# Patient Record
Sex: Female | Born: 1940 | Race: White | Hispanic: No | Marital: Single | Smoking: Never smoker
Health system: Southern US, Community
[De-identification: ages and names within clinical notes are randomized; demographics above are authoritative.]

## PROBLEM LIST (undated history)

## (undated) DIAGNOSIS — E119 Type 2 diabetes mellitus without complications: Secondary | ICD-10-CM

## (undated) DIAGNOSIS — C801 Malignant (primary) neoplasm, unspecified: Secondary | ICD-10-CM

## (undated) DIAGNOSIS — I639 Cerebral infarction, unspecified: Secondary | ICD-10-CM

## (undated) DIAGNOSIS — I10 Essential (primary) hypertension: Secondary | ICD-10-CM

## (undated) DIAGNOSIS — I509 Heart failure, unspecified: Secondary | ICD-10-CM

## (undated) HISTORY — PX: PARTIAL COLECTOMY: SHX5273

---

## 2019-09-18 ENCOUNTER — Other Ambulatory Visit: Payer: Self-pay

## 2019-09-18 ENCOUNTER — Emergency Department: Payer: Medicare HMO

## 2019-09-18 ENCOUNTER — Inpatient Hospital Stay
Admission: EM | Admit: 2019-09-18 | Discharge: 2019-09-21 | DRG: 871 | Disposition: A | Discharge status: Hospice | Payer: Medicare HMO | Attending: Internal Medicine | Admitting: Internal Medicine

## 2019-09-18 DIAGNOSIS — Z515 Encounter for palliative care: Secondary | ICD-10-CM | POA: Diagnosis not present

## 2019-09-18 DIAGNOSIS — W19XXXA Unspecified fall, initial encounter: Secondary | ICD-10-CM | POA: Diagnosis present

## 2019-09-18 DIAGNOSIS — C786 Secondary malignant neoplasm of retroperitoneum and peritoneum: Secondary | ICD-10-CM | POA: Diagnosis present

## 2019-09-18 DIAGNOSIS — E871 Hypo-osmolality and hyponatremia: Secondary | ICD-10-CM | POA: Diagnosis not present

## 2019-09-18 DIAGNOSIS — F329 Major depressive disorder, single episode, unspecified: Secondary | ICD-10-CM | POA: Diagnosis present

## 2019-09-18 DIAGNOSIS — I5032 Chronic diastolic (congestive) heart failure: Secondary | ICD-10-CM | POA: Diagnosis present

## 2019-09-18 DIAGNOSIS — K922 Gastrointestinal hemorrhage, unspecified: Secondary | ICD-10-CM | POA: Diagnosis present

## 2019-09-18 DIAGNOSIS — R54 Age-related physical debility: Secondary | ICD-10-CM | POA: Diagnosis not present

## 2019-09-18 DIAGNOSIS — R531 Weakness: Secondary | ICD-10-CM | POA: Diagnosis not present

## 2019-09-18 DIAGNOSIS — G9341 Metabolic encephalopathy: Secondary | ICD-10-CM | POA: Diagnosis present

## 2019-09-18 DIAGNOSIS — R195 Other fecal abnormalities: Secondary | ICD-10-CM | POA: Diagnosis present

## 2019-09-18 DIAGNOSIS — E222 Syndrome of inappropriate secretion of antidiuretic hormone: Secondary | ICD-10-CM | POA: Diagnosis present

## 2019-09-18 DIAGNOSIS — Z888 Allergy status to other drugs, medicaments and biological substances status: Secondary | ICD-10-CM

## 2019-09-18 DIAGNOSIS — Z6826 Body mass index (BMI) 26.0-26.9, adult: Secondary | ICD-10-CM | POA: Diagnosis not present

## 2019-09-18 DIAGNOSIS — E1143 Type 2 diabetes mellitus with diabetic autonomic (poly)neuropathy: Secondary | ICD-10-CM | POA: Diagnosis present

## 2019-09-18 DIAGNOSIS — Z9049 Acquired absence of other specified parts of digestive tract: Secondary | ICD-10-CM

## 2019-09-18 DIAGNOSIS — D62 Acute posthemorrhagic anemia: Secondary | ICD-10-CM | POA: Diagnosis present

## 2019-09-18 DIAGNOSIS — Z20822 Contact with and (suspected) exposure to covid-19: Secondary | ICD-10-CM | POA: Diagnosis present

## 2019-09-18 DIAGNOSIS — Z66 Do not resuscitate: Secondary | ICD-10-CM | POA: Diagnosis present

## 2019-09-18 DIAGNOSIS — Z8673 Personal history of transient ischemic attack (TIA), and cerebral infarction without residual deficits: Secondary | ICD-10-CM | POA: Diagnosis not present

## 2019-09-18 DIAGNOSIS — I639 Cerebral infarction, unspecified: Secondary | ICD-10-CM | POA: Diagnosis present

## 2019-09-18 DIAGNOSIS — I10 Essential (primary) hypertension: Secondary | ICD-10-CM

## 2019-09-18 DIAGNOSIS — K3184 Gastroparesis: Secondary | ICD-10-CM | POA: Diagnosis present

## 2019-09-18 DIAGNOSIS — E119 Type 2 diabetes mellitus without complications: Secondary | ICD-10-CM | POA: Diagnosis not present

## 2019-09-18 DIAGNOSIS — A419 Sepsis, unspecified organism: Secondary | ICD-10-CM | POA: Diagnosis present

## 2019-09-18 DIAGNOSIS — R112 Nausea with vomiting, unspecified: Secondary | ICD-10-CM | POA: Diagnosis present

## 2019-09-18 DIAGNOSIS — K659 Peritonitis, unspecified: Secondary | ICD-10-CM | POA: Diagnosis present

## 2019-09-18 DIAGNOSIS — I110 Hypertensive heart disease with heart failure: Secondary | ICD-10-CM | POA: Diagnosis present

## 2019-09-18 DIAGNOSIS — E875 Hyperkalemia: Secondary | ICD-10-CM | POA: Diagnosis present

## 2019-09-18 DIAGNOSIS — K567 Ileus, unspecified: Secondary | ICD-10-CM | POA: Diagnosis present

## 2019-09-18 DIAGNOSIS — E876 Hypokalemia: Secondary | ICD-10-CM | POA: Diagnosis present

## 2019-09-18 DIAGNOSIS — R140 Abdominal distension (gaseous): Secondary | ICD-10-CM

## 2019-09-18 DIAGNOSIS — R197 Diarrhea, unspecified: Secondary | ICD-10-CM | POA: Diagnosis not present

## 2019-09-18 DIAGNOSIS — I509 Heart failure, unspecified: Secondary | ICD-10-CM

## 2019-09-18 DIAGNOSIS — E440 Moderate protein-calorie malnutrition: Secondary | ICD-10-CM | POA: Diagnosis present

## 2019-09-18 DIAGNOSIS — C189 Malignant neoplasm of colon, unspecified: Secondary | ICD-10-CM | POA: Diagnosis present

## 2019-09-18 DIAGNOSIS — C19 Malignant neoplasm of rectosigmoid junction: Secondary | ICD-10-CM | POA: Diagnosis present

## 2019-09-18 DIAGNOSIS — R180 Malignant ascites: Secondary | ICD-10-CM | POA: Diagnosis present

## 2019-09-18 DIAGNOSIS — Z9221 Personal history of antineoplastic chemotherapy: Secondary | ICD-10-CM

## 2019-09-18 DIAGNOSIS — F32A Depression, unspecified: Secondary | ICD-10-CM | POA: Diagnosis present

## 2019-09-18 DIAGNOSIS — R6520 Severe sepsis without septic shock: Secondary | ICD-10-CM | POA: Diagnosis present

## 2019-09-18 DIAGNOSIS — D649 Anemia, unspecified: Secondary | ICD-10-CM | POA: Diagnosis present

## 2019-09-18 DIAGNOSIS — E860 Dehydration: Secondary | ICD-10-CM | POA: Diagnosis present

## 2019-09-18 HISTORY — DX: Cerebral infarction, unspecified: I63.9

## 2019-09-18 HISTORY — DX: Essential (primary) hypertension: I10

## 2019-09-18 HISTORY — DX: Type 2 diabetes mellitus without complications: E11.9

## 2019-09-18 HISTORY — DX: Malignant (primary) neoplasm, unspecified: C80.1

## 2019-09-18 HISTORY — DX: Heart failure, unspecified: I50.9

## 2019-09-18 LAB — URINALYSIS, COMPLETE (UACMP) WITH MICROSCOPIC
Bacteria, UA: NONE SEEN
Bilirubin Urine: NEGATIVE
Glucose, UA: NEGATIVE mg/dL
Ketones, ur: NEGATIVE mg/dL
Leukocytes,Ua: NEGATIVE
Nitrite: NEGATIVE
Protein, ur: NEGATIVE mg/dL
Specific Gravity, Urine: >1.046 — ABNORMAL HIGH (ref 1.005–1.030)
pH: 5 (ref 5.0–8.0)

## 2019-09-18 LAB — BASIC METABOLIC PANEL
Anion gap: 7 (ref 5–15)
Anion gap: 10 (ref 5–15)
BUN: 54 mg/dL — ABNORMAL HIGH (ref 8–23)
BUN: 52 mg/dL — ABNORMAL HIGH (ref 8–23)
CO2: 27 mmol/L (ref 22–32)
CO2: 27 mmol/L (ref 22–32)
Calcium: 8.5 mg/dL — ABNORMAL LOW (ref 8.9–10.3)
Calcium: 8.3 mg/dL — ABNORMAL LOW (ref 8.9–10.3)
Chloride: 90 mmol/L — ABNORMAL LOW (ref 98–111)
Chloride: 88 mmol/L — ABNORMAL LOW (ref 98–111)
Creatinine, Ser: 0.59 mg/dL (ref 0.44–1.00)
Creatinine, Ser: 0.6 mg/dL (ref 0.44–1.00)
GFR calc Af Amer: >60 mL/min (ref >60)
GFR calc Af Amer: >60 mL/min (ref >60)
GFR calc non Af Amer: >60 mL/min (ref >60)
GFR calc non Af Amer: >60 mL/min (ref >60)
Glucose, Bld: 167 mg/dL — ABNORMAL HIGH (ref 70–99)
Glucose, Bld: 156 mg/dL — ABNORMAL HIGH (ref 70–99)
Potassium: 5.4 mmol/L — ABNORMAL HIGH (ref 3.5–5.1)
Potassium: 5.4 mmol/L — ABNORMAL HIGH (ref 3.5–5.1)
Sodium: 124 mmol/L — ABNORMAL LOW (ref 135–145)
Sodium: 125 mmol/L — ABNORMAL LOW (ref 135–145)

## 2019-09-18 LAB — CBC WITH DIFFERENTIAL/PLATELET
Abs Immature Granulocytes: 0.35 10*3/uL — ABNORMAL HIGH (ref 0.00–0.07)
Basophils Absolute: 0.1 10*3/uL (ref 0.0–0.1)
Basophils Relative: 0 %
Eosinophils Absolute: 0 10*3/uL (ref 0.0–0.5)
Eosinophils Relative: 0 %
HCT: 25.3 % — ABNORMAL LOW (ref 36.0–46.0)
Hemoglobin: 8.7 g/dL — ABNORMAL LOW (ref 12.0–15.0)
Immature Granulocytes: 1 %
Lymphocytes Relative: 4 %
Lymphs Abs: 1.2 10*3/uL (ref 0.7–4.0)
MCH: 29.3 pg (ref 26.0–34.0)
MCHC: 34.4 g/dL (ref 30.0–36.0)
MCV: 85.2 fL (ref 80.0–100.0)
Monocytes Absolute: 1 10*3/uL (ref 0.1–1.0)
Monocytes Relative: 4 %
Neutro Abs: 24.2 10*3/uL — ABNORMAL HIGH (ref 1.7–7.7)
Neutrophils Relative %: 91 %
Platelets: 641 10*3/uL — ABNORMAL HIGH (ref 150–400)
RBC: 2.97 MIL/uL — ABNORMAL LOW (ref 3.87–5.11)
RDW: 13.1 % (ref 11.5–15.5)
Smear Review: NORMAL
WBC: 26.7 10*3/uL — ABNORMAL HIGH (ref 4.0–10.5)
nRBC: 0 % (ref 0.0–0.2)

## 2019-09-18 LAB — OSMOLALITY, URINE: Osmolality, Ur: 634 mOsm/kg (ref 300–900)

## 2019-09-18 LAB — CBC
HCT: 19.4 % — ABNORMAL LOW (ref 36.0–46.0)
Hemoglobin: 6.6 g/dL — ABNORMAL LOW (ref 12.0–15.0)
MCH: 29.1 pg (ref 26.0–34.0)
MCHC: 34 g/dL (ref 30.0–36.0)
MCV: 85.5 fL (ref 80.0–100.0)
Platelets: 416 10*3/uL — ABNORMAL HIGH (ref 150–400)
RBC: 2.27 MIL/uL — ABNORMAL LOW (ref 3.87–5.11)
RDW: 13 % (ref 11.5–15.5)
WBC: 19 10*3/uL — ABNORMAL HIGH (ref 4.0–10.5)
nRBC: 0 % (ref 0.0–0.2)

## 2019-09-18 LAB — LACTIC ACID, PLASMA
Lactic Acid, Venous: 2.2 mmol/L (ref 0.5–1.9)
Lactic Acid, Venous: 3.7 mmol/L (ref 0.5–1.9)
Lactic Acid, Venous: 1.8 mmol/L (ref 0.5–1.9)

## 2019-09-18 LAB — PROTIME-INR
INR: 1.1 (ref 0.8–1.2)
Prothrombin Time: 13.6 seconds (ref 11.4–15.2)

## 2019-09-18 LAB — APTT: aPTT: 26 seconds (ref 24–36)

## 2019-09-18 LAB — HEPATIC FUNCTION PANEL
ALT: 38 U/L (ref 0–44)
AST: 29 U/L (ref 15–41)
Albumin: 2 g/dL — ABNORMAL LOW (ref 3.5–5.0)
Alkaline Phosphatase: 211 U/L — ABNORMAL HIGH (ref 38–126)
Bilirubin, Direct: <0.1 mg/dL (ref 0.0–0.2)
Total Bilirubin: 0.4 mg/dL (ref 0.3–1.2)
Total Protein: 5 g/dL — ABNORMAL LOW (ref 6.5–8.1)

## 2019-09-18 LAB — BRAIN NATRIURETIC PEPTIDE: B Natriuretic Peptide: 147.7 pg/mL — ABNORMAL HIGH (ref 0.0–100.0)

## 2019-09-18 LAB — AMMONIA: Ammonia: 24 umol/L (ref 9–35)

## 2019-09-18 LAB — PREPARE RBC (CROSSMATCH)

## 2019-09-18 LAB — MAGNESIUM: Magnesium: 1.9 mg/dL (ref 1.7–2.4)

## 2019-09-18 LAB — ABO/RH: ABO/RH(D): O POS

## 2019-09-18 LAB — SODIUM, URINE, RANDOM: Sodium, Ur: <10 mmol/L

## 2019-09-18 LAB — SARS CORONAVIRUS 2 BY RT PCR (HOSPITAL ORDER, PERFORMED IN ~~LOC~~ HOSPITAL LAB): SARS Coronavirus 2: NEGATIVE

## 2019-09-18 MED ORDER — SODIUM CHLORIDE 0.9 % IV SOLN
2.0000 g | Freq: Two times a day (BID) | INTRAVENOUS | Status: DC
  Administered 2019-09-19 – 2019-09-21 (×5): 2 g via INTRAVENOUS
  Filled 2019-09-18 (×6): qty 2

## 2019-09-18 MED ORDER — LACTATED RINGERS IV BOLUS
1000.0000 mL | Freq: Once | INTRAVENOUS | Status: AC
  Administered 2019-09-18: 1000 mL via INTRAVENOUS

## 2019-09-18 MED ORDER — ONDANSETRON HCL 4 MG/2ML IJ SOLN
4.0000 mg | Freq: Three times a day (TID) | INTRAMUSCULAR | Status: DC | PRN
  Administered 2019-09-18: 4 mg via INTRAVENOUS
  Filled 2019-09-18: qty 2

## 2019-09-18 MED ORDER — METRONIDAZOLE IN NACL 5-0.79 MG/ML-% IV SOLN
500.0000 mg | Freq: Once | INTRAVENOUS | Status: AC
  Administered 2019-09-18: 500 mg via INTRAVENOUS
  Filled 2019-09-18: qty 100

## 2019-09-18 MED ORDER — SODIUM CHLORIDE 0.9 % IV SOLN: INTRAVENOUS | Status: DC

## 2019-09-18 MED ORDER — METRONIDAZOLE IN NACL 5-0.79 MG/ML-% IV SOLN
500.0000 mg | Freq: Three times a day (TID) | INTRAVENOUS | Status: DC
  Administered 2019-09-19 – 2019-09-21 (×8): 500 mg via INTRAVENOUS
  Filled 2019-09-18 (×12): qty 100

## 2019-09-18 MED ORDER — LACTATED RINGERS IV SOLN: INTRAVENOUS | Status: DC

## 2019-09-18 MED ORDER — VANCOMYCIN HCL 500 MG/100ML IV SOLN
500.0000 mg | Freq: Two times a day (BID) | INTRAVENOUS | Status: DC
  Administered 2019-09-19 – 2019-09-21 (×5): 500 mg via INTRAVENOUS
  Filled 2019-09-18 (×9): qty 100

## 2019-09-18 MED ORDER — MORPHINE SULFATE (PF) 2 MG/ML IV SOLN
0.5000 mg | INTRAVENOUS | Status: DC | PRN
  Administered 2019-09-18: 0.5 mg via INTRAVENOUS
  Filled 2019-09-18: qty 1

## 2019-09-18 MED ORDER — LACTATED RINGERS IV BOLUS (SEPSIS)
1000.0000 mL | Freq: Once | INTRAVENOUS | Status: AC
  Administered 2019-09-18: 1000 mL via INTRAVENOUS

## 2019-09-18 MED ORDER — IOHEXOL 300 MG/ML  SOLN
100.0000 mL | Freq: Once | INTRAMUSCULAR | Status: AC | PRN
  Administered 2019-09-18: 100 mL via INTRAVENOUS

## 2019-09-18 MED ORDER — ACETAMINOPHEN 325 MG PO TABS: 650.0000 mg | ORAL_TABLET | Freq: Four times a day (QID) | ORAL | Status: DC | PRN

## 2019-09-18 MED ORDER — SODIUM CHLORIDE 0.9% IV SOLUTION
Freq: Once | INTRAVENOUS | Status: AC
  Filled 2019-09-18: qty 250

## 2019-09-18 MED ORDER — CHLORHEXIDINE GLUCONATE CLOTH 2 % EX PADS
6.0000 | MEDICATED_PAD | Freq: Every day | CUTANEOUS | Status: DC
  Administered 2019-09-19 – 2019-09-21 (×3): 6 via TOPICAL

## 2019-09-18 MED ORDER — PANTOPRAZOLE SODIUM 40 MG IV SOLR
40.0000 mg | Freq: Two times a day (BID) | INTRAVENOUS | Status: DC
  Administered 2019-09-18 – 2019-09-21 (×6): 40 mg via INTRAVENOUS
  Filled 2019-09-18 (×5): qty 40

## 2019-09-18 MED ORDER — VANCOMYCIN HCL IN DEXTROSE 1-5 GM/200ML-% IV SOLN
1000.0000 mg | Freq: Once | INTRAVENOUS | Status: AC
  Administered 2019-09-18: 1000 mg via INTRAVENOUS
  Filled 2019-09-18: qty 200

## 2019-09-18 MED ORDER — PANTOPRAZOLE SODIUM 40 MG IV SOLR
40.0000 mg | Freq: Once | INTRAVENOUS | Status: DC
  Filled 2019-09-18: qty 40

## 2019-09-18 MED ORDER — SODIUM CHLORIDE 0.9 % IV SOLN
2.0000 g | Freq: Once | INTRAVENOUS | Status: AC
  Administered 2019-09-18: 2 g via INTRAVENOUS
  Filled 2019-09-18: qty 2

## 2019-09-18 NOTE — Progress Notes (Signed)
Notified bedside nurse of need to draw repeat lactic acid. 

## 2019-09-18 NOTE — Progress Notes (Signed)
CODE SEPSIS - PHARMACY COMMUNICATION  **Broad Spectrum Antibiotics should be administered within 1 hour of Sepsis diagnosis**  Time Code Sepsis Called/Page Received: 7847  Antibiotics Ordered: cefepime, Flagyl, Vancomycin  Time of 1st antibiotic administration: 1022  Additional action taken by pharmacy: Called ED RN at 1022 to inform of ~15 min left for abx administration  If necessary, Name of Provider/Nurse Contacted:     Rocky Morel ,PharmD Clinical Pharmacist  09/18/2019  10:39 AM

## 2019-09-18 NOTE — Progress Notes (Signed)
Notified provider of need to draw repeat lactic acid @ 1430.

## 2019-09-18 NOTE — Consult Note (Addendum)
Pharmacy Antibiotic Note  Donna Yang is a 79 y.o. female admitted on 09/18/2019 with sepsis. Pt presented with dizziness and fall. PMH includes colon cancer, HTN, diabetes, stroke, and CHF. Pharmacy has been consulted for vancomycin and cefepime dosing.  Plan: Vancomycin 500 mg IV q12h.  Goal trough 15-20 mcg/mL. Cefepime 2 g IV q12h Monitor Scr with AM labs   Height: 5\' 5"  (165.1 cm) Weight: 66.2 kg (146 lb) IBW/kg (Calculated) : 57  Temp (24hrs), Avg:97.3 F (36.3 C), Min:97.3 F (36.3 C), Max:97.3 F (36.3 C)  Recent Labs  Lab 09/18/19 0813 09/18/19 0850 09/18/19 0937 09/18/19 1216 09/18/19 1417  WBC 26.7*  --   --   --  19.0*  CREATININE  --   --  0.59  --  0.60  LATICACIDVEN  --  2.2*  --  3.7* 1.8    Estimated Creatinine Clearance: 53 mL/min (by C-G formula based on SCr of 0.6 mg/dL).    Allergies  Allergen Reactions  . Methylprednisolone     Other reaction(s): Migraine    Antimicrobials this admission: 9/13 cefepime >>  9/13 vancomycin >>  9/13 metronidazole >>  Microbiology results: 9/13 BCx: pending 9/13 COVID PCR: negative 9/13 C. Diff PCR: pending 9/13 GI panel: pending  Thank you for allowing pharmacy to be a part of this patient's care.  Benn Moulder, PharmD Pharmacy Resident  09/18/2019 4:02 PM

## 2019-09-18 NOTE — H&P (Addendum)
History and Physical    Donna Yang XHB:[id number] DOB: April 04, 1940 DOA: 09/18/2019  Referring MD/NP/PA:   PCP: System, Pcp Not In   Patient coming from:  The patient is coming from home.  At baseline, pt is independent for most of ADL.        Chief Complaint: Generalized weakness, altered mental status, fall, nausea, vomiting, diarrhea, abdominal pain, dark stool  HPI: Donna Yang is a 79 y.o. female with medical history significant of colon cancer on chemotherapy (s/p of a partial colectomy), hypertension, diabetes mellitus, stroke, CHF, who presents with generalized weakness, altered mental status, fall, nausea, vomiting, abdominal pain.  Patient has AMS, and is unable to provide accurate medical history. I called her sister to have collected medical history.  Per her sister, patient has history of colon cancer, had partial colonectomy December 2021.  Patient had chemotherapy which was stopped due to severe diarrhea.  Recently patient was started on immunotherapy in [place].  Last dose was 6 days ago.  In the past several days, patient has been feeling increasingly weak.  She has poor appetite and decreased oral intake.  She has nausea, vomiting with nonbilious nonbloody vomitus, diffuse abdominal pain.  Patient has chronic intermittent diarrhea.  Does not seem to have fever or chills.  Per her sister, patient does not have chest pain, cough, shortness of breath.  Not sure if patient has symptoms of UTI. Pt fell today when she attempted to go to the bathroom, and started feeling lightheaded and dizzy. She has some pain in coccyx area.  Patient was noted to have dark stool bowel movement in the ED.  Patient is confused.  No facial droop or slurred speech.  She moves all extremities.  Patient was initially hypotensive with blood pressure 79/52, which improved to 135/73 after giving 2 L lactated Ringer solution in ED.  ED Course: pt was found to have WBC 26.3, lactic acid 2.2  -->3.7, ammonia 24,  INR 1.1, negative Covid PCR, potassium 5.4, renal function okay, sodium 124, temperature 97.3, tachycardia with heart rate of 105, RR 18, oxygen saturation 96% on room air.  Chest x-ray negative.  CT head is negative for acute intracranial abnormalities. Pt is admitted to [facility] bed as inpatient.  CT-abdomen/pelvis: 1. 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 2. Moderate to large volume malignant ascites throughout the abdomen and pelvis. 3. Indeterminate left adrenal gland lesion. Correlation with prior imaging studies is recommended. 4. Distended fluid-filled stomach and small bowel loops could be due to gastroparesis/ileus. 5. Suspect rectal prolapse. 6. Small left pleural effusion with overlying atelectasis. 7. Aortic atherosclerosis. 8. Aortic Atherosclerosis (ICD10-I70.0).   Review of Systems:   General: no fevers, chills, no body weight gain, has poor appetite, has fatigue HEENT: no blurry vision, hearing changes or sore throat Respiratory: no dyspnea, coughing, wheezing CV: no chest pain, no palpitations GI: no nausea, vomiting, abdominal pain, diarrhea, constipation GU: no dysuria, burning on urination, increased urinary frequency, hematuria  Ext: no leg edema Neuro: no unilateral weakness, numbness, or tingling, no vision change or hearing loss Skin: no rash, no skin tear. MSK: No muscle spasm, no deformity, no limitation of range of movement in spin Heme: No easy bruising.  Travel history: No recent long distant travel.  Allergy:  Allergies  Allergen Reactions  . Methylprednisolone     Other reaction(s): Migraine    Past Medical History:  Diagnosis Date  . Cancer ([place])   . CHF (congestive heart  failure) ([place])   . Diabetes mellitus without complication ([place])   . Hypertension   . Stroke [facility])     Past Surgical History:  Procedure Laterality Date  . PARTIAL COLECTOMY      Social History:   reports that she has never smoked. She has never used smokeless tobacco. She reports previous alcohol use. She reports previous drug use.  Family History: No family history on file.   Prior to Admission medications   Not on File    Physical Exam: Vitals:   09/18/19 1300 09/18/19 1400 09/18/19 1641 09/18/19 1700  BP: 122/81 (!) 109/56 111/66 124/68  Pulse: 100 (!) 104 (!) 108 (!) 107  Resp: 18 (!) 22 18 20   Temp:   97.8 F (36.6 C) 97.8 F (36.6 C)  TempSrc:   Oral Oral  SpO2: 95% 100%  98%  Weight:      Height:       General: Not in acute distress HEENT:       Eyes: PERRL, EOMI, no scleral icterus.       ENT: No discharge from the ears and nose, no pharynx injection, no tonsillar enlargement.        Neck: No JVD, no bruit, no mass felt. Heme: No neck lymph node enlargement. Cardiac: S1/S2, RRR, No murmurs, No gallops or rubs. Respiratory:  No rales, wheezing, rhonchi or rubs. GI: Soft, nondistended, nontender, no rebound pain, no organomegaly, BS present. GU: No hematuria Ext: No pitting leg edema bilaterally. 2+DP/PT pulse bilaterally. Musculoskeletal: No joint deformities, No joint redness or warmth, no limitation of ROM in spin. Skin: No rashes.  Neuro: Alert, oriented X3, cranial nerves II-XII grossly intact, moves all extremities. Psych: Patient is not psychotic, no suicidal or hemocidal ideation.  Labs on Admission: I have personally reviewed following labs and imaging studies  CBC: Recent Labs  Lab 09/18/19 0813 09/18/19 1417  WBC 26.7* 19.0*  NEUTROABS 24.2*  --   HGB 8.7* 6.6*  HCT 25.3* 19.4*  MCV 85.2 85.5  PLT 641* 938*   Basic Metabolic Panel: Recent Labs  Lab 09/18/19 0937 09/18/19 1417  NA 124* 125*  K 5.4* 5.4*  CL 90* 88*  CO2 27 27  GLUCOSE 167* 156*  BUN 54* 52*  CREATININE 0.59 0.60  CALCIUM 8.5* 8.3*  MG 1.9  --    GFR: Estimated Creatinine Clearance: 53 mL/min (by C-G formula based on SCr of 0.6 mg/dL). Liver Function  Tests: Recent Labs  Lab 09/18/19 0813  AST 29  ALT 38  ALKPHOS 211*  BILITOT 0.4  PROT 5.0*  ALBUMIN 2.0*   No results for input(s): LIPASE, AMYLASE in the last 168 hours. Recent Labs  Lab 09/18/19 1417  AMMONIA 24   Coagulation Profile: Recent Labs  Lab 09/18/19 0937  INR 1.1   Cardiac Enzymes: No results for input(s): CKTOTAL, CKMB, CKMBINDEX, TROPONINI in the last 168 hours. BNP (last 3 results) No results for input(s): PROBNP in the last 8760 hours. HbA1C: No results for input(s): HGBA1C in the last 72 hours. CBG: No results for input(s): GLUCAP in the last 168 hours. Lipid Profile: No results for input(s): CHOL, HDL, LDLCALC, TRIG, CHOLHDL, LDLDIRECT in the last 72 hours. Thyroid Function Tests: No results for input(s): TSH, T4TOTAL, FREET4, T3FREE, THYROIDAB in the last 72 hours. Anemia Panel: No results for input(s): VITAMINB12, FOLATE, FERRITIN, TIBC, IRON, RETICCTPCT in the last 72 hours. Urine analysis:    Component Value Date/Time   COLORURINE YELLOW (A) 09/18/2019 1351  APPEARANCEUR HAZY (A) 09/18/2019 1351   LABSPEC >1.046 (H) 09/18/2019 1351   PHURINE 5.0 09/18/2019 1351   GLUCOSEU NEGATIVE 09/18/2019 1351   HGBUR SMALL (A) 09/18/2019 1351   BILIRUBINUR NEGATIVE 09/18/2019 1351   KETONESUR NEGATIVE 09/18/2019 1351   PROTEINUR NEGATIVE 09/18/2019 1351   NITRITE NEGATIVE 09/18/2019 1351   LEUKOCYTESUR NEGATIVE 09/18/2019 1351   Sepsis Labs: @LABRCNTIP (procalcitonin:4,lacticidven:4) ) Recent Results (from the past 240 hour(s))  SARS Coronavirus 2 by RT PCR (hospital order, performed in [facility] hospital lab) Nasopharyngeal Nasopharyngeal Swab     Status: None   Collection Time: 09/18/19 11:22 AM   Specimen: Nasopharyngeal Swab  Result Value Ref Range Status   SARS Coronavirus 2 NEGATIVE NEGATIVE Final    Comment: (NOTE) SARS-CoV-2 target nucleic acids are NOT DETECTED.  The SARS-CoV-2 RNA is generally detectable in upper and  lower respiratory specimens during the acute phase of infection. The lowest concentration of SARS-CoV-2 viral copies this assay can detect is 250 copies / mL. A negative result does not preclude SARS-CoV-2 infection and should not be used as the sole basis for treatment or other patient management decisions.  A negative result may occur with improper specimen collection / handling, submission of specimen other than nasopharyngeal swab, presence of viral mutation(s) within the areas targeted by this assay, and inadequate number of viral copies (<250 copies / mL). A negative result must be combined with clinical observations, patient history, and epidemiological information.  Fact Sheet for Patients:   StrictlyIdeas.no  Fact Sheet for Healthcare Providers: BankingDealers.co.za  This test is not yet approved or  cleared by the [place] FDA and has been authorized for detection and/or diagnosis of SARS-CoV-2 by FDA under an Emergency Use Authorization (EUA).  This EUA will remain in effect (meaning this test can be used) for the duration of the COVID-19 declaration under Section 564(b)(1) of the Act, 21 U.S.C. section 360bbb-3(b)(1), unless the authorization is terminated or revoked sooner.  Performed at [facility], [address], [place], [place] [address]      Radiological Exams on Admission: CT Head Wo Contrast  Result Date: 09/18/2019 CLINICAL DATA:  Mental status change. EXAM: CT HEAD WITHOUT CONTRAST TECHNIQUE: Contiguous axial images were obtained from the base of the skull through the vertex without intravenous contrast. COMPARISON:  None. FINDINGS: Brain: Age related cerebral atrophy, ventriculomegaly and periventricular white matter disease. No extra-axial fluid collections are identified. No CT findings for acute hemispheric infarction or intracranial hemorrhage. No mass lesions. There is a focal area of  encephalomalacia involving the cortical and subcortical region of the right parietal lobe most consistent with an old infarct. The brainstem and cerebellum are normal. Vascular: Scattered vascular calcifications. No aneurysm or hyperdense vessels. Skull: Hyperostosis frontalis interna. No fracture or bone lesion. Sinuses/Orbits: The paranasal sinuses and mastoid air cells are clear. The globes are intact. Other: No scalp lesions or scalp hematoma. IMPRESSION: 1. Age related cerebral atrophy, ventriculomegaly and periventricular white matter disease. 2. Remote right parietal lobe infarct. 3. No acute intracranial findings or mass lesions. Electronically Signed   By: Marijo Sanes M.D.   On: 09/18/2019 12:15   CT Abdomen Pelvis W Contrast  Result Date: 09/18/2019 CLINICAL DATA:  Abdominal pain and nausea. History of colon cancer on chemotherapy. EXAM: CT ABDOMEN AND PELVIS WITH CONTRAST TECHNIQUE: Multidetector CT imaging of the abdomen and pelvis was performed using the standard protocol following bolus administration of intravenous contrast. CONTRAST:  144mL OMNIPAQUE IOHEXOL 300 MG/ML  SOLN  COMPARISON:  None. FINDINGS: Lower chest: Small left pleural effusion is noted with overlying atelectasis. No worrisome pulmonary nodules. The heart is within normal limits in size for age. Aortic and three-vessel coronary artery calcifications are noted. Slightly dilated fluid-filled distal esophagus is noted. No obvious hiatal hernia. Hepatobiliary: No intrahepatic hepatic metastatic disease is identified. However findings are consistent with peritoneal surface disease involving the liver. No intrahepatic biliary dilatation. The gallbladder is surgically absent. No common bile duct dilatation. Pancreas: Fairly significant pancreatic atrophy but no mass, inflammation or ductal dilatation. Spleen: Normal size. No focal lesions. Adrenals/Urinary Tract: There is a solid enhancing left adrenal gland lesion which is  indeterminate. Correlation with prior imaging studies is recommended. The right adrenal gland is normal. There are bilateral renal cysts but no worrisome renal lesions or hydronephrosis. The bladder is grossly normal. Stomach/Bowel: The stomach is moderately distended with fluid. I do not see an obvious gastric outlet obstruction. Findings could be due to gastroparesis. The small bowel loops demonstrate scattered fluid and mild bowel wall thickening and enhancement without obvious discrete mass. No obstruction. Anastomosis is noted in the right abdomen like this sided patient's tumor resection. Scattered moderate sigmoid colon diverticulosis without findings for acute diverticulitis. Suspect rectal prolapse. Vascular/Lymphatic: Moderate atherosclerotic calcifications involving the aorta and branch vessels but no aneurysm or dissection. The major venous structures are patent. Small scattered mesenteric and retroperitoneal lymph nodes but no mass or overt adenopathy. However, there is extensive omental disease. Index mass in the right abdomen adjacent to the inferior aspect of the liver measures 6.2 x 4.0 cm on image number 43 and is directly invading the medial distal tip of the liver. Reproductive: The uterus and ovaries are unremarkable. Other: Moderate to large volume malignant ascites throughout the abdomen and pelvis. Musculoskeletal: No significant bony findings. IMPRESSION: 1. 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 2. Moderate to large volume malignant ascites throughout the abdomen and pelvis. 3. Indeterminate left adrenal gland lesion. Correlation with prior imaging studies is recommended. 4. Distended fluid-filled stomach and small bowel loops could be due to gastroparesis/ileus. 5. Suspect rectal prolapse. 6. Small left pleural effusion with overlying atelectasis. 7. Aortic atherosclerosis. Aortic Atherosclerosis (ICD10-I70.0). Electronically Signed   By: Marijo Sanes M.D.   On: 09/18/2019 12:29   DG Chest Portable 1 View  Result Date: 09/18/2019 CLINICAL DATA:  Weakness.  Colon cancer. EXAM: PORTABLE CHEST 1 VIEW COMPARISON:  None. FINDINGS: Right Port-A-Cath tip at low right atrium. Right rotator cuff repair. Patient rotated right. Midline trachea. Normal heart size. No pleural effusion or pneumothorax. Minimal subsegmental atelectasis in the medial left lower lobe. IMPRESSION: No acute cardiopulmonary disease. Right Port-A-Cath tip at low right atrium. If cavoatrial junction position is desired, retraction of approximately 4 cm suggested. Electronically Signed   By: Abigail Miyamoto M.D.   On: 09/18/2019 10:44     EKG: Independently reviewed.  Sinus rhythm, tachycardia, QTC 427, low voltage, poor R wave progression, Q waves in lead III/aVF.   Assessment/Plan Principal Problem:   Severe sepsis (HCC) Active Problems:   Dark stools   Diabetes mellitus without complication (HCC)   Hypertension   Stroke (HCC)   Colon cancer (HCC)   Generalized weakness   Fall   Hyponatremia   Hyperkalemia   CHF (congestive heart failure) (HCC)   Normocytic anemia   Depression   Acute metabolic encephalopathy   Nausea vomiting and diarrhea   GIB (gastrointestinal bleeding)   Severe sepsis vs SIRS:  Patient meets criteria for sepsis with leukocytosis and tachycardia with heart rate of 105.  Patient has hypotension initially.  Lactic acid is elevated to 3.7.  Has altered mental status. Source of infection is not clear.  Chest x-ray negative.  -Admit to MedSurg bed as inpatient -Antibiotics: Cefepime, Flagyl and vancomycin -will get Procalcitonin and trend lactic acid levels per sepsis protocol. -IVF: 2L of LR bolus in ED, followed by 75 cc/h of NS -f/u Blood culture and UA -will consult palliative care  GIB and dark stool and normocytic anemia: Hgb 8.7 -->6.6.  Likely related to colon cancer. Baseline creatinine is not available.  Requested medical record  which is pending.  Dr. Vicente Males of GI is consulted.  - transfuse 2 units of blood now - GI consulted by Ed, will follow up recommendations - NPO now - IVF - Start IV pantoprazole 40 mg bid - Zofran IV for nausea - Avoid NSAIDs and SQ heparin - Maintain IV access (2 large bore IVs if possible). - Monitor closely and follow q6h cbc, transfuse as necessary, if Hgb<7.0 - LaB: INR, PTT and type screen  Diabetes mellitus without complication ([place]): Baseline A1c not available.  Patient is not taking medications for diabetes.  Blood sugar 167 on BMP -Monitor blood sugar -Check CBG every morning  Hypertension: Patient had hypotension with blood pressure 79/52, which improved to 135/73.  Patient is taking metoprolol 50 mg daily at home. -Hold blood pressure medications due to softer blood pressure -monitoring Bp closely  Stroke ([place]) -Lipitor  Colon cancer [facility]): S/p for partial colectomy.  Currently on immunotherapy -Follow-up with oncology in [place]  Generalized weakness -PT/OT when able to (not ordered yet)  Fall: CT head negative for acute intracranial abnormalities.  Patient has some pain in coccyx area. CT abdomen/pelvis did not show coccyx bone fracture. -PT/OT when able to  Hyponatremia: Most likely poor oral intake and dehydration - Will check urine sodium, urine osmolality, serum osmolality. - IVF: 2L LR in ED, will continue with IV NS at 75 mL/h - f/u by BMP q8h - avoid over correction too fast due to risk of central pontine myelinolysis  Hyperkalemia: K=5.4 -on IVF -f/u BMP q8h  CHF (congestive heart failure) ([place]): No 2D echo on record, not sure which type of CHF.  Given long history of hypertension, patient may have diastolic congestive heart failure.  Patient does not have leg edema JVD.  No pulmonary edema chest x-ray with CHF is compensated. -Watch volume status closely  Depression -Continue home medications  Acute metabolic encephalopathy: Likely multifactorial  etiology as listed above -Frequent neuro check  Nausea vomiting and diarrhea and abdominal pain: Patient's abdominal pain is likely due to peritoneal carcinomatosis -Follow-up C. difficile PCR and GI pathogen panel -IV fluid as above -As needed Zofran     DVT ppx: SCD Code Status: DNR per her sister Family Communication:  Yes, patient's sister by phone Disposition Plan:  Anticipate discharge back to previous environment Consults called:  Dr. Vicente Males Admission status: Med-surg bed as inpt     Status is: Inpatient  Remains inpatient appropriate because:Inpatient level of care appropriate due to severity of illness.  Patient has multiple comorbidities, now presents with severe sepsis versus SIRS, dark stool/GI bleeding, hyponatremia, hypokalemia, fall.  Patient has peritoneal carcinomatosis by CT scan.  Her presentation is highly complicated.  Patient is at high risk of deteriorating.  Need to be treated in hospital for at least 2 days   Dispo: The patient is from:  Home              Anticipated d/c is to: Home              Anticipated d/c date is: 2 days              Patient currently is not medically stable to d/c.          Date of Service 09/18/2019    Ivor Costa [facility]   If 7PM-7AM, please contact night-coverage [email or web address] 09/18/2019, 6:35 PM

## 2019-09-18 NOTE — Progress Notes (Signed)
Notified bedside nurse of need to draw blood cultures.  

## 2019-09-18 NOTE — ED Notes (Signed)
Report called to receiving RN and pt transported to admission bed by this RN on monitor, tolerated well.

## 2019-09-18 NOTE — ED Notes (Signed)
Dr. Charna Archer bedside for re-evaluation and updating pt and her sister on status, plan for admission to inpt. Pt repositioned; purewick repositioned

## 2019-09-18 NOTE — Progress Notes (Signed)
PHARMACY -  BRIEF ANTIBIOTIC NOTE   Pharmacy has received consult(s) for vancomycin and cefepime from an ED provider.  The patient's profile has been reviewed for ht/wt/allergies/indication/available labs.    One time order(s) placed for vanc 1 g + cefepime 2 g  Further antibiotics/pharmacy consults should be ordered by admitting physician if indicated.                       Thank you,  Tawnya Crook, PharmD 09/18/2019  9:48 AM

## 2019-09-18 NOTE — ED Provider Notes (Addendum)
[facility] Emergency Department Provider Note   ____________________________________________   First MD Initiated Contact with Patient 09/18/19 0805     (approximate)  I have reviewed the triage vital signs and the nursing notes.   HISTORY  Chief Complaint Hypotension    HPI Donna Yang is a 79 y.o. female with past medical history of colon cancer, hypertension, diabetes, and CHF who presents to the ED complaining of generalized weakness and near syncope.  History is limited due to patient's somnolence and majority of history is obtained from her sister.  She states that the patient was diagnosed with colon cancer towards the end of last year, had surgery to remove the area of her colon near her cecum and has continued receiving chemotherapy infusions in [place].  She had her last infusion 6 days ago but had been feeling increasingly weak with very poor appetite.  She came to stay with her sisters 2 days ago but has continued to get weaker and when they attempted to help her to the bathroom today she started feeling lightheaded and lost consciousness.  Her sister states she has not had any fevers recently but has been feeling nauseous and vomited a couple times.  She has not been having regular bowel movements recently and did complain of some abdominal pain.  She has not complained of any dysuria, hematuria, chest pain, cough, or shortness of breath.  EMS was called to the house earlier today and found the patient to be hypotensive.        Past Medical History:  Diagnosis Date  . Cancer ([place])   . CHF (congestive heart failure) ([place])   . Diabetes mellitus without complication ([place])   . Hypertension   . Stroke [facility])     Patient Active Problem List   Diagnosis Date Noted  . Dark stools 09/18/2019  . Diabetes mellitus without complication ([place])   . Hypertension   . Stroke ([place])   . Colon cancer [facility])     Past Surgical History:  Procedure  Laterality Date  . PARTIAL COLECTOMY      Prior to Admission medications   Not on File    Allergies Patient has no known allergies.  No family history on file.  Social History Social History   Tobacco Use  . Smoking status: Never Smoker  . Smokeless tobacco: Never Used  Substance Use Topics  . Alcohol use: Not Currently  . Drug use: Not Currently    Review of Systems  Constitutional: No fever/chills.  Positive for generalized weakness. Eyes: No visual changes. ENT: No sore throat. Cardiovascular: Denies chest pain.  Positive for syncope. Respiratory: Denies shortness of breath. Gastrointestinal: Positive for abdominal pain.  Positive for nausea and vomiting.  No diarrhea.  No constipation. Genitourinary: Negative for dysuria. Musculoskeletal: Negative for back pain. Skin: Negative for rash. Neurological: Negative for headaches, focal weakness or numbness.  ____________________________________________   PHYSICAL EXAM:  VITAL SIGNS: ED Triage Vitals  Enc Vitals Group     BP 09/18/19 0750 (!) 79/52     Pulse Rate 09/18/19 0750 (!) 105     Resp 09/18/19 0750 16     Temp --      Temp Source 09/18/19 0750 Oral     SpO2 09/18/19 0750 100 %     Weight 09/18/19 0752 146 lb (66.2 kg)     Height 09/18/19 0752 5\' 5"  (1.651 m)     Head Circumference --      Peak  Flow --      Pain Score 09/18/19 0751 4     Pain Loc --      Pain Edu? --      Excl. in [place]? --     Constitutional: Somnolent but arousable to voice, oriented to person and time, but not place.  Very pale appearing. Eyes: Conjunctivae are normal.  Pupils equal round and reactive to light bilaterally. Head: Atraumatic. Nose: No congestion/rhinnorhea. Mouth/Throat: Mucous membranes are dry. Neck: Normal ROM Cardiovascular: Tachycardic, regular rhythm. Grossly normal heart sounds. Respiratory: Normal respiratory effort.  No retractions. Lungs CTAB. Gastrointestinal: Soft and diffusely tender to palpation,  mildly distended Genitourinary: deferred Musculoskeletal: No lower extremity tenderness nor edema. Neurologic:  Normal speech and language. No gross focal neurologic deficits are appreciated. Skin:  Skin is warm, dry and intact. No rash noted. Psychiatric: Mood and affect are normal. Speech and behavior are normal.  ____________________________________________   LABS (all labs ordered are listed, but only abnormal results are displayed)  Labs Reviewed  CBC WITH DIFFERENTIAL/PLATELET - Abnormal; Notable for the following components:      Result Value   WBC 26.7 (*)    RBC 2.97 (*)    Hemoglobin 8.7 (*)    HCT 25.3 (*)    Platelets 641 (*)    Neutro Abs 24.2 (*)    Abs Immature Granulocytes 0.35 (*)    All other components within normal limits  HEPATIC FUNCTION PANEL - Abnormal; Notable for the following components:   Total Protein 5.0 (*)    Albumin 2.0 (*)    Alkaline Phosphatase 211 (*)    All other components within normal limits  LACTIC ACID, PLASMA - Abnormal; Notable for the following components:   Lactic Acid, Venous 2.2 (*)    All other components within normal limits  LACTIC ACID, PLASMA - Abnormal; Notable for the following components:   Lactic Acid, Venous 3.7 (*)    All other components within normal limits  BASIC METABOLIC PANEL - Abnormal; Notable for the following components:   Sodium 124 (*)    Potassium 5.4 (*)    Chloride 90 (*)    Glucose, Bld 167 (*)    BUN 54 (*)    Calcium 8.5 (*)    All other components within normal limits  SARS CORONAVIRUS 2 BY RT PCR (HOSPITAL ORDER, [place] [facility])  CULTURE, BLOOD (ROUTINE X 2)  CULTURE, BLOOD (ROUTINE X 2)  MAGNESIUM  PROTIME-INR  URINALYSIS, COMPLETE (UACMP) WITH MICROSCOPIC  LACTIC ACID, PLASMA  BRAIN NATRIURETIC PEPTIDE  CBG MONITORING, ED  TYPE AND SCREEN   ____________________________________________  EKG  ED ECG REPORT I, Blake Divine, the attending physician,  personally viewed and interpreted this ECG.   Date: 09/18/2019  EKG Time: 7:52  Rate: 107  Rhythm: sinus tachycardia  Axis: Normal  Intervals:none  ST&T Change: None  ED ECG REPORT I, Blake Divine, the attending physician, personally viewed and interpreted this ECG.   Date: 09/18/2019  EKG Time: 8:12  Rate: 106  Rhythm: sinus tachycardia  Axis: Normal  Intervals:none  ST&T Change: None   PROCEDURES  Procedure(s) performed (including Critical Care):  .Critical Care Performed by: Blake Divine, MD Authorized by: Blake Divine, MD   Critical care provider statement:    Critical care time (minutes):  45   Critical care time was exclusive of:  Separately billable procedures and treating other patients and teaching time   Critical care was necessary to treat or prevent imminent  or life-threatening deterioration of the following conditions:  Sepsis   Critical care was time spent personally by me on the following activities:  Discussions with consultants, evaluation of patient's response to treatment, examination of patient, ordering and performing treatments and interventions, ordering and review of laboratory studies, ordering and review of radiographic studies, pulse oximetry, re-evaluation of patient's condition, obtaining history from patient or surrogate and review of old charts   I assumed direction of critical care for this patient from another provider in my specialty: no       ____________________________________________   INITIAL IMPRESSION / ASSESSMENT AND PLAN / ED COURSE       79 year old female with past medical history of colon cancer status post partial colectomy, hypertension, diabetes, CHF who presents to the ED for generalized weakness and syncope at home earlier today.  She was noted to be hypotensive by EMS but blood pressure has now normalized, although she appears dehydrated with tachycardia and we will start IV fluid bolus.  EKG shows sinus  tachycardia without evidence of arrhythmia or ischemia.  Patient does appear very pale and I am concerned for possible GI bleed, family reports she is not anticoagulated.  We will check CT head given her altered mental status as well as CT abdomen/pelvis given her diffuse abdominal tenderness.  Screen chest x-ray and UA for evidence of infectious process, and anticipate admission.  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  Chest x-ray is negative for acute process and while there is no clear source for infection, elevated white count of 26 as well as patient's initial tachycardia and hypothermia is concerning for sepsis.  Patient was treated with broad-spectrum antibiotics and received appropriate IV fluid bolus.  UA is pending at this time.  There was also questionable rectal prolapse on CT scan, no evidence of this on physical exam however patient noted to have melanotic stool that is guaiac positive.  She remains hemodynamically stable and we will need to trend H&H, she was given bolus dose of Protonix.  Case discussed with hospitalist for admission.      ____________________________________________   FINAL CLINICAL IMPRESSION(S) / ED DIAGNOSES  Final diagnoses:  Sepsis without acute organ dysfunction, due to unspecified organism [facility])  Gastrointestinal hemorrhage, unspecified gastrointestinal hemorrhage type     ED Discharge Orders    None       Note:  This document was prepared using Dragon voice recognition software and may include unintentional dictation errors.   Blake Divine, MD 09/18/19 1323    Blake Divine, MD 09/18/19 1323

## 2019-09-18 NOTE — ED Triage Notes (Signed)
Pt comes into the ED via EMS from home after standing and got dizzy and fell, c/o pain to the coccyx. Pt is slow to answer, CBG 341, pt is currently going through treatment for colon CA.

## 2019-09-18 NOTE — ED Notes (Signed)
Comprehensive note - assumed care of pt at 1900, pt alert and oriented only to self, however capable of holding a consistent conversation. Denies cp, sob, or fever. 1 unit of blood infusing. Sitter arrived at bedside. Mits placed on pt bilateral hands for safety as previous primary RN stated pt was pulling at her IV and port infusions. Pt talking in full sentences with regular and unlabored breathing.

## 2019-09-19 DIAGNOSIS — R54 Age-related physical debility: Secondary | ICD-10-CM

## 2019-09-19 DIAGNOSIS — R112 Nausea with vomiting, unspecified: Secondary | ICD-10-CM

## 2019-09-19 DIAGNOSIS — R197 Diarrhea, unspecified: Secondary | ICD-10-CM

## 2019-09-19 DIAGNOSIS — A419 Sepsis, unspecified organism: Principal | ICD-10-CM

## 2019-09-19 DIAGNOSIS — R531 Weakness: Secondary | ICD-10-CM

## 2019-09-19 DIAGNOSIS — Z66 Do not resuscitate: Secondary | ICD-10-CM

## 2019-09-19 DIAGNOSIS — D62 Acute posthemorrhagic anemia: Secondary | ICD-10-CM

## 2019-09-19 DIAGNOSIS — K922 Gastrointestinal hemorrhage, unspecified: Secondary | ICD-10-CM

## 2019-09-19 DIAGNOSIS — R6520 Severe sepsis without septic shock: Secondary | ICD-10-CM

## 2019-09-19 LAB — CBC
HCT: 26.7 % — ABNORMAL LOW (ref 36.0–46.0)
HCT: 27 % — ABNORMAL LOW (ref 36.0–46.0)
HCT: 28.7 % — ABNORMAL LOW (ref 36.0–46.0)
HCT: 26.3 % — ABNORMAL LOW (ref 36.0–46.0)
Hemoglobin: 9.4 g/dL — ABNORMAL LOW (ref 12.0–15.0)
Hemoglobin: 9.3 g/dL — ABNORMAL LOW (ref 12.0–15.0)
Hemoglobin: 9.8 g/dL — ABNORMAL LOW (ref 12.0–15.0)
Hemoglobin: 9 g/dL — ABNORMAL LOW (ref 12.0–15.0)
MCH: 29.2 pg (ref 26.0–34.0)
MCH: 28.6 pg (ref 26.0–34.0)
MCH: 28.6 pg (ref 26.0–34.0)
MCH: 28.8 pg (ref 26.0–34.0)
MCHC: 35.2 g/dL (ref 30.0–36.0)
MCHC: 34.4 g/dL (ref 30.0–36.0)
MCHC: 34.1 g/dL (ref 30.0–36.0)
MCHC: 34.2 g/dL (ref 30.0–36.0)
MCV: 82.9 fL (ref 80.0–100.0)
MCV: 83.1 fL (ref 80.0–100.0)
MCV: 83.7 fL (ref 80.0–100.0)
MCV: 84.3 fL (ref 80.0–100.0)
Platelets: 400 10*3/uL (ref 150–400)
Platelets: 413 10*3/uL — ABNORMAL HIGH (ref 150–400)
Platelets: 452 10*3/uL — ABNORMAL HIGH (ref 150–400)
Platelets: 416 10*3/uL — ABNORMAL HIGH (ref 150–400)
RBC: 3.22 MIL/uL — ABNORMAL LOW (ref 3.87–5.11)
RBC: 3.25 MIL/uL — ABNORMAL LOW (ref 3.87–5.11)
RBC: 3.43 MIL/uL — ABNORMAL LOW (ref 3.87–5.11)
RBC: 3.12 MIL/uL — ABNORMAL LOW (ref 3.87–5.11)
RDW: 14 % (ref 11.5–15.5)
RDW: 14.4 % (ref 11.5–15.5)
RDW: 14.4 % (ref 11.5–15.5)
RDW: 14.4 % (ref 11.5–15.5)
WBC: 18.5 10*3/uL — ABNORMAL HIGH (ref 4.0–10.5)
WBC: 20.1 10*3/uL — ABNORMAL HIGH (ref 4.0–10.5)
WBC: 20.6 10*3/uL — ABNORMAL HIGH (ref 4.0–10.5)
WBC: 20.8 10*3/uL — ABNORMAL HIGH (ref 4.0–10.5)
nRBC: 0 % (ref 0.0–0.2)
nRBC: 0 % (ref 0.0–0.2)
nRBC: 0 % (ref 0.0–0.2)
nRBC: 0 % (ref 0.0–0.2)

## 2019-09-19 LAB — BASIC METABOLIC PANEL
Anion gap: 8 (ref 5–15)
Anion gap: 9 (ref 5–15)
BUN: 62 mg/dL — ABNORMAL HIGH (ref 8–23)
BUN: 64 mg/dL — ABNORMAL HIGH (ref 8–23)
CO2: 28 mmol/L (ref 22–32)
CO2: 30 mmol/L (ref 22–32)
Calcium: 8.5 mg/dL — ABNORMAL LOW (ref 8.9–10.3)
Calcium: 8.6 mg/dL — ABNORMAL LOW (ref 8.9–10.3)
Chloride: 89 mmol/L — ABNORMAL LOW (ref 98–111)
Chloride: 90 mmol/L — ABNORMAL LOW (ref 98–111)
Creatinine, Ser: 0.59 mg/dL (ref 0.44–1.00)
Creatinine, Ser: 0.67 mg/dL (ref 0.44–1.00)
GFR calc Af Amer: >60 mL/min (ref >60)
GFR calc Af Amer: >60 mL/min (ref >60)
GFR calc non Af Amer: >60 mL/min (ref >60)
GFR calc non Af Amer: >60 mL/min (ref >60)
Glucose, Bld: 146 mg/dL — ABNORMAL HIGH (ref 70–99)
Glucose, Bld: 148 mg/dL — ABNORMAL HIGH (ref 70–99)
Potassium: 5 mmol/L (ref 3.5–5.1)
Potassium: 5.2 mmol/L — ABNORMAL HIGH (ref 3.5–5.1)
Sodium: 127 mmol/L — ABNORMAL LOW (ref 135–145)
Sodium: 127 mmol/L — ABNORMAL LOW (ref 135–145)

## 2019-09-19 LAB — TYPE AND SCREEN
ABO/RH(D): O POS
Antibody Screen: NEGATIVE
Unit division: 0
Unit division: 0

## 2019-09-19 LAB — BPAM RBC
Blood Product Expiration Date: 202109262359
Blood Product Expiration Date: 202110112359
ISSUE DATE / TIME: 202109131626
ISSUE DATE / TIME: 202109131902
Unit Type and Rh: 5100
Unit Type and Rh: 5100

## 2019-09-19 LAB — OSMOLALITY: Osmolality: 285 mOsm/kg (ref 275–295)

## 2019-09-19 LAB — GLUCOSE, CAPILLARY: Glucose-Capillary: 129 mg/dL — ABNORMAL HIGH (ref 70–99)

## 2019-09-19 LAB — PROCALCITONIN: Procalcitonin: 0.73 ng/mL

## 2019-09-19 MED ORDER — MORPHINE SULFATE (PF) 2 MG/ML IV SOLN
2.0000 mg | INTRAVENOUS | Status: DC | PRN
  Administered 2019-09-20 – 2019-09-21 (×3): 2 mg via INTRAVENOUS
  Filled 2019-09-19 (×3): qty 1

## 2019-09-19 MED ORDER — ESCITALOPRAM OXALATE 10 MG PO TABS: 20.0000 mg | ORAL_TABLET | Freq: Every day | ORAL | Status: DC

## 2019-09-19 MED ORDER — ATORVASTATIN CALCIUM 20 MG PO TABS
80.0000 mg | ORAL_TABLET | ORAL | Status: DC
  Administered 2019-09-20: 80 mg via ORAL
  Filled 2019-09-19: qty 4

## 2019-09-19 MED ORDER — ATORVASTATIN CALCIUM 20 MG PO TABS: 40.0000 mg | ORAL_TABLET | ORAL | Status: DC

## 2019-09-19 MED ORDER — DIPHENOXYLATE-ATROPINE 2.5-0.025 MG PO TABS: 1.0000 | ORAL_TABLET | Freq: Four times a day (QID) | ORAL | Status: DC | PRN

## 2019-09-19 MED ORDER — BUSPIRONE HCL 10 MG PO TABS
5.0000 mg | ORAL_TABLET | Freq: Two times a day (BID) | ORAL | Status: DC
  Administered 2019-09-19 – 2019-09-21 (×4): 5 mg via ORAL
  Filled 2019-09-19 (×4): qty 1

## 2019-09-19 MED ORDER — ZOLPIDEM TARTRATE 5 MG PO TABS
5.0000 mg | ORAL_TABLET | Freq: Every evening | ORAL | Status: DC | PRN
  Administered 2019-09-19 – 2019-09-20 (×2): 5 mg via ORAL
  Filled 2019-09-19 (×2): qty 1

## 2019-09-19 MED ORDER — ESCITALOPRAM OXALATE 10 MG PO TABS
20.0000 mg | ORAL_TABLET | Freq: Every day | ORAL | Status: DC
  Administered 2019-09-19 – 2019-09-21 (×3): 20 mg via ORAL
  Filled 2019-09-19 (×3): qty 2

## 2019-09-19 MED ORDER — HYDROCODONE-ACETAMINOPHEN 5-325 MG PO TABS
1.0000 | ORAL_TABLET | Freq: Four times a day (QID) | ORAL | Status: DC | PRN
  Filled 2019-09-19: qty 1

## 2019-09-19 MED ORDER — NORTRIPTYLINE HCL 10 MG PO CAPS
30.0000 mg | ORAL_CAPSULE | Freq: Every day | ORAL | Status: DC
  Administered 2019-09-19 – 2019-09-20 (×2): 30 mg via ORAL
  Filled 2019-09-19 (×3): qty 3

## 2019-09-19 MED ORDER — HYDROCODONE-ACETAMINOPHEN 5-325 MG PO TABS
1.0000 | ORAL_TABLET | Freq: Four times a day (QID) | ORAL | Status: DC | PRN
  Administered 2019-09-19 – 2019-09-21 (×4): 1 via ORAL
  Filled 2019-09-19 (×3): qty 1

## 2019-09-19 MED ORDER — ENSURE ENLIVE PO LIQD
237.0000 mL | Freq: Two times a day (BID) | ORAL | Status: DC
  Administered 2019-09-20 – 2019-09-21 (×3): 237 mL via ORAL

## 2019-09-19 MED ORDER — BUTALBITAL-APAP-CAFFEINE 50-325-40 MG PO TABS
1.0000 | ORAL_TABLET | Freq: Four times a day (QID) | ORAL | Status: DC | PRN
  Filled 2019-09-19: qty 1

## 2019-09-19 NOTE — Consult Note (Signed)
[name] , MD [address], [place], [place], [place], [address] [address], [place], [place], [place], [address] Phone: [phone]  Fax: [phone]  Consultation  Referring Provider:   Dr Roosevelt Locks  Primary Care Physician:  System, Pcp Not In Primary Gastroenterologist: None   Reason for Consultation:     Dark stool and Low Hb  Date of Admission:  09/18/2019 Date of Consultation:  09/19/2019         HPI:   Donna Yang is a 79 y.o. female presented to the hospital with weakness , vomiting , diarrhea, abdominal pain and dark stool. H/p partial colectomy in 12/2019 for colon cancer , chemo was stopped due to diarrhea at [facility]   , HTN, CHF , CVA in the past .    On admission had AMS and CT scan in the ER demonstrated peritoneal carcinomatosis with omental caking , moderate to large volume malignant ascites , distended fluid filled stomach and small bowel with possible ileus/gastroparesis, possible rectal prolapse .   On admission Hb 8.7 grams ---> 6.6 grams ---> transfusion----> 9.4 grams    Reviewed notes by palliative care it appears the patient wants comfort care measures.  Appears that the cancer has progressed.  The patient states that she had a dark emesis over the weekend.  No significant abdominal pain.  Denies any NSAID use.  Has noticed some blood that is bright red on the stool associated with a bowel movement.  No hematemesis since coming into the hospital.  Past Medical History:  Diagnosis Date  . Cancer ([place])   . CHF (congestive heart failure) ([place])   . Diabetes mellitus without complication ([place])   . Hypertension   . Stroke [facility])     Past Surgical History:  Procedure Laterality Date  . PARTIAL COLECTOMY      Prior to Admission medications   Medication Sig Start Date End Date Taking? Authorizing Provider  atorvastatin (LIPITOR) 80 MG tablet Take 40-80 mg by mouth daily. Take one tablet (80 mg) by mouth on Mon, Wed, Fri. Take one-half tablet  (40 mg) on Tues, Thurs, Sat.   Yes [provider]  busPIRone (BUSPAR) 5 MG tablet Take 5 mg by mouth 2 (two) times daily.   Yes [provider]  butalbital-acetaminophen-caffeine (FIORICET) 50-325-40 MG tablet Take by mouth 2 (two) times daily as needed for headache.   Yes [provider]  diphenoxylate-atropine (LOMOTIL) 2.5-0.025 MG tablet Take 1-2 tablets by mouth every 6 (six) hours as needed for diarrhea or loose stools.   Yes [provider]  ergocalciferol (VITAMIN D2) 1.25 MG (50000 UT) capsule Take 50,000 Units by mouth once a week.   Yes [provider]  escitalopram (LEXAPRO) 20 MG tablet Take 20 mg by mouth daily.   Yes [provider]  HYDROcodone-acetaminophen (NORCO/VICODIN) 5-325 MG tablet Take 1 tablet by mouth every 6 (six) hours as needed for moderate pain.   Yes [provider]  hydrOXYzine (VISTARIL) 25 MG capsule Take 25-50 mg by mouth 3 (three) times daily as needed for anxiety (sleep).   Yes [provider]  nortriptyline (PAMELOR) 10 MG capsule Take 30 mg by mouth at bedtime.   Yes [provider]  omeprazole (PRILOSEC) 40 MG capsule Take 40 mg by mouth daily.   Yes [provider]  ondansetron (ZOFRAN) 8 MG tablet Take 8 mg by mouth every 8 (eight) hours as needed for nausea or vomiting.   Yes [provider]  potassium chloride (KLOR-CON) 20 MEQ packet Take 20 mEq by mouth 2 (two) times daily.   Yes [provider]  zolpidem (AMBIEN) 10 MG tablet Take 10 mg by mouth at bedtime.   Yes [provider]    No family history on file.   Social History   Tobacco Use  . Smoking status: Never Smoker  . Smokeless tobacco: Never Used  Substance Use Topics  . Alcohol use: Not Currently  . Drug use: Not Currently    Allergies as of 09/18/2019 - Review Complete 09/18/2019  Allergen Reaction Noted  . Methylprednisolone  09/18/2019    Review of Systems:     All systems reviewed and negative except where noted in HPI.   Physical Exam:  Vital signs in last 24 hours: Temp:  [97.8 F (36.6 C)-98.7 F (37.1 C)] 97.9 F (36.6 C) (09/14 0737) Pulse Rate:  [98-110] 110 (09/14 0737) Resp:  [16-24] 17 (09/14 0737) BP: (91-137)/(56-81) 137/68 (09/14 0737) SpO2:  [68 %-100 %] 100 % (09/14 0737) Weight:  [72.7 kg] 72.7 kg (09/13 2304)   General:   Pleasant, cooperative in NAD Head:  Normocephalic and atraumatic. Eyes:   No icterus.   Conjunctiva pink. PERRLA. Ears:  Normal auditory acuity. Neck:  Supple; no masses or thyroidomegaly Lungs: Respirations even and unlabored. Lungs clear to auscultation bilaterally.   No wheezes, crackles, or rhonchi.  Heart:  Regular rate and rhythm;  Without murmur, clicks, rubs or gallops Abdomen:  Soft, distended, fluid thrill present.  No guarding rigidity no tenderness bowel sounds present Neurologic:  Alert and oriented x3;  grossly normal neurologically. Psych:  Alert and cooperative. Normal affect.  LAB RESULTS: Recent Labs    09/18/19 1417 09/19/19 0018 09/19/19 0911  WBC 19.0* 18.5* 20.1*  HGB 6.6* 9.4* 9.3*  HCT 19.4* 26.7* 27.0*  PLT 416* 400 413*   BMET Recent Labs    09/18/19 1417 09/19/19 0018 09/19/19 0423  NA 125* 127* 127*  K 5.4* 5.2* 5.0  CL 88* 90* 89*  CO2 27 28 30   GLUCOSE 156* 146* 148*  BUN 52* 62* 64*  CREATININE 0.60 0.59 0.67  CALCIUM 8.3* 8.5* 8.6*   LFT Recent Labs    09/18/19 0813  PROT 5.0*  ALBUMIN 2.0*  AST 29  ALT 38  ALKPHOS 211*  BILITOT 0.4  BILIDIR <0.1  IBILI NOT CALCULATED   PT/INR Recent Labs    09/18/19 0937  LABPROT 13.6  INR 1.1    STUDIES: CT Head Wo Contrast  Result Date: 09/18/2019 CLINICAL DATA:  Mental status change. EXAM: CT HEAD WITHOUT CONTRAST TECHNIQUE: Contiguous axial images were obtained from the base of the skull through the vertex without intravenous contrast. COMPARISON:  None. FINDINGS: Brain: Age related cerebral  atrophy, ventriculomegaly and periventricular white matter disease. No extra-axial fluid collections are identified. No CT findings for acute hemispheric infarction or intracranial hemorrhage. No mass lesions. There is a focal area of encephalomalacia involving the cortical and subcortical region of the right parietal lobe most consistent with an old infarct. The brainstem and cerebellum are normal. Vascular: Scattered vascular calcifications. No aneurysm or hyperdense vessels. Skull: Hyperostosis frontalis interna. No fracture or bone lesion. Sinuses/Orbits: The paranasal sinuses and mastoid air cells are clear. The globes are intact. Other: No scalp lesions or scalp hematoma. IMPRESSION: 1. Age related cerebral atrophy, ventriculomegaly and periventricular white matter disease. 2. Remote right parietal lobe infarct. 3. No acute intracranial findings or mass lesions. Electronically Signed   By:  Marijo Sanes M.D.   On: 09/18/2019 12:15   CT Abdomen Pelvis W Contrast  Result Date: 09/18/2019 CLINICAL DATA:  Abdominal pain and nausea. History of colon cancer on chemotherapy. EXAM: CT ABDOMEN AND PELVIS WITH CONTRAST TECHNIQUE: Multidetector CT imaging of the abdomen and pelvis was performed using the standard protocol following bolus administration of intravenous contrast. CONTRAST:  18mL OMNIPAQUE IOHEXOL 300 MG/ML  SOLN COMPARISON:  None. FINDINGS: Lower chest: Small left pleural effusion is noted with overlying atelectasis. No worrisome pulmonary nodules. The heart is within normal limits in size for age. Aortic and three-vessel coronary artery calcifications are noted. Slightly dilated fluid-filled distal esophagus is noted. No obvious hiatal hernia. Hepatobiliary: No intrahepatic hepatic metastatic disease is identified. However findings are consistent with peritoneal surface disease involving the liver. No intrahepatic biliary dilatation. The gallbladder is surgically absent. No common bile duct  dilatation. Pancreas: Fairly significant pancreatic atrophy but no mass, inflammation or ductal dilatation. Spleen: Normal size. No focal lesions. Adrenals/Urinary Tract: There is a solid enhancing left adrenal gland lesion which is indeterminate. Correlation with prior imaging studies is recommended. The right adrenal gland is normal. There are bilateral renal cysts but no worrisome renal lesions or hydronephrosis. The bladder is grossly normal. Stomach/Bowel: The stomach is moderately distended with fluid. I do not see an obvious gastric outlet obstruction. Findings could be due to gastroparesis. The small bowel loops demonstrate scattered fluid and mild bowel wall thickening and enhancement without obvious discrete mass. No obstruction. Anastomosis is noted in the right abdomen like this sided patient's tumor resection. Scattered moderate sigmoid colon diverticulosis without findings for acute diverticulitis. Suspect rectal prolapse. Vascular/Lymphatic: Moderate atherosclerotic calcifications involving the aorta and branch vessels but no aneurysm or dissection. The major venous structures are patent. Small scattered mesenteric and retroperitoneal lymph nodes but no mass or overt adenopathy. However, there is extensive omental disease. Index mass in the right abdomen adjacent to the inferior aspect of the liver measures 6.2 x 4.0 cm on image number 43 and is directly invading the medial distal tip of the liver. Reproductive: The uterus and ovaries are unremarkable. Other: Moderate to large volume malignant ascites throughout the abdomen and pelvis. Musculoskeletal: No significant bony findings. IMPRESSION: 1. 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 2. Moderate to large volume malignant ascites throughout the abdomen and pelvis. 3. Indeterminate left adrenal gland lesion. Correlation with prior imaging studies is recommended. 4. Distended fluid-filled stomach and  small bowel loops could be due to gastroparesis/ileus. 5. Suspect rectal prolapse. 6. Small left pleural effusion with overlying atelectasis. 7. Aortic atherosclerosis. Aortic Atherosclerosis (ICD10-I70.0). Electronically Signed   By: Marijo Sanes M.D.   On: 09/18/2019 12:29   DG Chest Portable 1 View  Result Date: 09/18/2019 CLINICAL DATA:  Weakness.  Colon cancer. EXAM: PORTABLE CHEST 1 VIEW COMPARISON:  None. FINDINGS: Right Port-A-Cath tip at low right atrium. Right rotator cuff repair. Patient rotated right. Midline trachea. Normal heart size. No pleural effusion or pneumothorax. Minimal subsegmental atelectasis in the medial left lower lobe. IMPRESSION: No acute cardiopulmonary disease. Right Port-A-Cath tip at low right atrium. If cavoatrial junction position is desired, retraction of approximately 4 cm suggested. Electronically Signed   By: Abigail Miyamoto M.D.   On: 09/18/2019 10:44      Impression / Plan:   Donna Yang is a 79 y.o. y/o female with recent history of colorectal cancer undergoing partial colectomy in December 2020 and underwent chemotherapy complicated by diarrhea which  had to be stopped.  Admitted with weakness, dark-colored stools. Drop in hemoglobin since admission. CT scan of the abdomen demonstrates large volume ascites and peritoneal carcinomatosis. In addition I ileus and distention of the stomach and small bowel noted.  It appears that her malignancy has progressed as per notes from palliative care and Dr. Grayland Ormond  Plan 1. Monitor CBC and transfuse as needed 2. At this point is not possible to do an upper endoscopy with the ileus and distended stomach.  If he subjected to anesthesia it is possible that she may aspirate.  It is possible that the ileus could be from malignant partial obstruction.. 3.  Discussed options with the patient she has not decided whether she wants endoscopy at this point of time.  She will let me know tomorrow about her thoughts.  If we  do decide to pursue endoscopy she probably would need therapeutic paracentesis to decrease the pressure on her diaphragm from the ascites before we subject her to anesthesia.  In addition we would need abdominal imaging to confirm that the ileus has resolved.   Thank you for involving me in the care of this patient.      LOS: 1 day   [name], MD  09/19/2019, 10:43 AM

## 2019-09-19 NOTE — Consult Note (Signed)
[place]  Telephone:(336) [phone] Fax:(336) [phone]  ID: Sim Boast OB: [birth year]  MR#: [id number]  HEN#:[id number]  Patient Care Team: System, Pcp Not In as PCP - General  CHIEF COMPLAINT: Stage IV colon cancer, admitted with concern of sepsis.  INTERVAL HISTORY: Patient is a 79 year old female who is actively receiving treatment for stage IV colon cancer in [place], [place]. She admitted to the hospital overnight with altered mental status and concern for sepsis. She also had black tarry stools and significant anemia requiring blood transfusion in the emergency room. Currently, patient is alert and oriented x3 and is nearly back to her baseline. She continues to have significant weakness and fatigue. She has no neurologic complaints. She denies any fevers. She has a fair appetite, but denies weight loss. She has no chest pain, shortness of breath, cough, or hemoptysis. She denies any nausea, vomiting, constipation, or diarrhea. She has no urinary complaints. Patient offers no further specific complaints today.  REVIEW OF SYSTEMS:   Review of Systems  Constitutional: Positive for malaise/fatigue. Negative for fever and weight loss.  Respiratory: Negative.  Negative for cough, hemoptysis and shortness of breath.   Cardiovascular: Negative.  Negative for chest pain.  Gastrointestinal: Positive for blood in stool and melena. Negative for abdominal pain.  Genitourinary: Negative.   Musculoskeletal: Negative.  Negative for back pain.  Skin: Negative.  Negative for rash.  Neurological: Positive for weakness. Negative for dizziness, focal weakness and headaches.  Psychiatric/Behavioral: The patient is not nervous/anxious.     As per HPI. Otherwise, a complete review of systems is negative.  PAST MEDICAL HISTORY: Past Medical History:  Diagnosis Date  . Cancer ([place])   . CHF (congestive heart failure) ([place])   . Diabetes mellitus without complication  ([place])   . Hypertension   . Stroke [facility])     PAST SURGICAL HISTORY: Past Surgical History:  Procedure Laterality Date  . PARTIAL COLECTOMY      FAMILY HISTORY: No family history on file.  ADVANCED DIRECTIVES (Y/N):  @ADVDIR @  HEALTH MAINTENANCE: Social History   Tobacco Use  . Smoking status: Never Smoker  . Smokeless tobacco: Never Used  Substance Use Topics  . Alcohol use: Not Currently  . Drug use: Not Currently     Colonoscopy:  PAP:  Bone density:  Lipid panel:  Allergies  Allergen Reactions  . Methylprednisolone     Other reaction(s): Migraine    Current Facility-Administered Medications  Medication Dose Route Frequency Provider Last Rate Last Admin  . 0.9 %  sodium chloride infusion   Intravenous Continuous Ivor Costa, MD      . acetaminophen (TYLENOL) tablet 650 mg  650 mg Oral Q6H PRN Ivor Costa, MD      . ceFEPIme (MAXIPIME) 2 g in sodium chloride 0.9 % 100 mL IVPB  2 g Intravenous Q12H Deatra Robinson B, RPH 200 mL/hr at 09/19/19 0845 2 g at 09/19/19 0845  . Chlorhexidine Gluconate Cloth 2 % PADS 6 each  6 each Topical Daily Ivor Costa, MD   6 each at 09/19/19 0848  . escitalopram (LEXAPRO) tablet 20 mg  20 mg Oral Daily Sharion Settler, NP   20 mg at 09/19/19 0842  . HYDROcodone-acetaminophen (NORCO/VICODIN) 5-325 MG per tablet 1 tablet  1 tablet Oral Q6H PRN Sharion Settler, NP   1 tablet at 09/19/19 0308  . metroNIDAZOLE (FLAGYL) IVPB 500 mg  500 mg Intravenous Cleophas Dunker, MD 100 mL/hr at 09/19/19 1236 500 mg at  09/19/19 1236  . morphine 2 MG/ML injection 2 mg  2 mg Intravenous Q4H PRN Sharion Settler, NP      . ondansetron [facility]) injection 4 mg  4 mg Intravenous Q8H PRN Ivor Costa, MD   4 mg at 09/18/19 1343  . pantoprazole (PROTONIX) injection 40 mg  40 mg Intravenous Q12H Ivor Costa, MD   40 mg at 09/19/19 0842  . vancomycin (VANCOREADY) IVPB 500 mg/100 mL  500 mg Intravenous Q12H Deatra Robinson B, RPH 100 mL/hr at 09/19/19 0706 500 mg  at 09/19/19 0706    OBJECTIVE: Vitals:   09/18/19 2323 09/19/19 0737  BP: 118/64 137/68  Pulse: (!) 108 (!) 110  Resp: 16 17  Temp: 98.7 F (37.1 C) 97.9 F (36.6 C)  SpO2: 97% 100%     Body mass index is 26.67 kg/m.    ECOG FS:2 - Symptomatic, <50% confined to bed  General: Thin d, no acute distress. Resting in bed comfortably. Eyes: Pink conjunctiva, anicteric sclera. HEENT: Normocephalic, moist mucous membranes. Lungs: No audible wheezing or coughing. Heart: Regular rate and rhythm. Abdomen: Soft, nontender, no obvious distention. Musculoskeletal: No edema, cyanosis, or clubbing. Neuro: Alert, answering all questions appropriately. Cranial nerves grossly intact. Skin: No rashes or petechiae noted. Psych: Normal affect.   LAB RESULTS:  Lab Results  Component Value Date   NA 127 (L) 09/19/2019   K 5.0 09/19/2019   CL 89 (L) 09/19/2019   CO2 30 09/19/2019   GLUCOSE 148 (H) 09/19/2019   BUN 64 (H) 09/19/2019   CREATININE 0.67 09/19/2019   CALCIUM 8.6 (L) 09/19/2019   PROT 5.0 (L) 09/18/2019   ALBUMIN 2.0 (L) 09/18/2019   AST 29 09/18/2019   ALT 38 09/18/2019   ALKPHOS 211 (H) 09/18/2019   BILITOT 0.4 09/18/2019   GFRNONAA >60 09/19/2019   GFRAA >60 09/19/2019    Lab Results  Component Value Date   WBC 20.1 (H) 09/19/2019   NEUTROABS 24.2 (H) 09/18/2019   HGB 9.3 (L) 09/19/2019   HCT 27.0 (L) 09/19/2019   MCV 83.1 09/19/2019   PLT 413 (H) 09/19/2019     STUDIES: CT Head Wo Contrast  Result Date: 09/18/2019 CLINICAL DATA:  Mental status change. EXAM: CT HEAD WITHOUT CONTRAST TECHNIQUE: Contiguous axial images were obtained from the base of the skull through the vertex without intravenous contrast. COMPARISON:  None. FINDINGS: Brain: Age related cerebral atrophy, ventriculomegaly and periventricular white matter disease. No extra-axial fluid collections are identified. No CT findings for acute hemispheric infarction or intracranial hemorrhage. No mass  lesions. There is a focal area of encephalomalacia involving the cortical and subcortical region of the right parietal lobe most consistent with an old infarct. The brainstem and cerebellum are normal. Vascular: Scattered vascular calcifications. No aneurysm or hyperdense vessels. Skull: Hyperostosis frontalis interna. No fracture or bone lesion. Sinuses/Orbits: The paranasal sinuses and mastoid air cells are clear. The globes are intact. Other: No scalp lesions or scalp hematoma. IMPRESSION: 1. Age related cerebral atrophy, ventriculomegaly and periventricular white matter disease. 2. Remote right parietal lobe infarct. 3. No acute intracranial findings or mass lesions. Electronically Signed   By: Marijo Sanes M.D.   On: 09/18/2019 12:15   CT Abdomen Pelvis W Contrast  Result Date: 09/18/2019 CLINICAL DATA:  Abdominal pain and nausea. History of colon cancer on chemotherapy. EXAM: CT ABDOMEN AND PELVIS WITH CONTRAST TECHNIQUE: Multidetector CT imaging of the abdomen and pelvis was performed using the standard protocol following bolus administration of intravenous contrast.  CONTRAST:  133mL OMNIPAQUE IOHEXOL 300 MG/ML  SOLN COMPARISON:  None. FINDINGS: Lower chest: Small left pleural effusion is noted with overlying atelectasis. No worrisome pulmonary nodules. The heart is within normal limits in size for age. Aortic and three-vessel coronary artery calcifications are noted. Slightly dilated fluid-filled distal esophagus is noted. No obvious hiatal hernia. Hepatobiliary: No intrahepatic hepatic metastatic disease is identified. However findings are consistent with peritoneal surface disease involving the liver. No intrahepatic biliary dilatation. The gallbladder is surgically absent. No common bile duct dilatation. Pancreas: Fairly significant pancreatic atrophy but no mass, inflammation or ductal dilatation. Spleen: Normal size. No focal lesions. Adrenals/Urinary Tract: There is a solid enhancing left adrenal  gland lesion which is indeterminate. Correlation with prior imaging studies is recommended. The right adrenal gland is normal. There are bilateral renal cysts but no worrisome renal lesions or hydronephrosis. The bladder is grossly normal. Stomach/Bowel: The stomach is moderately distended with fluid. I do not see an obvious gastric outlet obstruction. Findings could be due to gastroparesis. The small bowel loops demonstrate scattered fluid and mild bowel wall thickening and enhancement without obvious discrete mass. No obstruction. Anastomosis is noted in the right abdomen like this sided patient's tumor resection. Scattered moderate sigmoid colon diverticulosis without findings for acute diverticulitis. Suspect rectal prolapse. Vascular/Lymphatic: Moderate atherosclerotic calcifications involving the aorta and branch vessels but no aneurysm or dissection. The major venous structures are patent. Small scattered mesenteric and retroperitoneal lymph nodes but no mass or overt adenopathy. However, there is extensive omental disease. Index mass in the right abdomen adjacent to the inferior aspect of the liver measures 6.2 x 4.0 cm on image number 43 and is directly invading the medial distal tip of the liver. Reproductive: The uterus and ovaries are unremarkable. Other: Moderate to large volume malignant ascites throughout the abdomen and pelvis. Musculoskeletal: No significant bony findings. IMPRESSION: 1. 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 2. Moderate to large volume malignant ascites throughout the abdomen and pelvis. 3. Indeterminate left adrenal gland lesion. Correlation with prior imaging studies is recommended. 4. Distended fluid-filled stomach and small bowel loops could be due to gastroparesis/ileus. 5. Suspect rectal prolapse. 6. Small left pleural effusion with overlying atelectasis. 7. Aortic atherosclerosis. Aortic Atherosclerosis (ICD10-I70.0).  Electronically Signed   By: Marijo Sanes M.D.   On: 09/18/2019 12:29   DG Chest Portable 1 View  Result Date: 09/18/2019 CLINICAL DATA:  Weakness.  Colon cancer. EXAM: PORTABLE CHEST 1 VIEW COMPARISON:  None. FINDINGS: Right Port-A-Cath tip at low right atrium. Right rotator cuff repair. Patient rotated right. Midline trachea. Normal heart size. No pleural effusion or pneumothorax. Minimal subsegmental atelectasis in the medial left lower lobe. IMPRESSION: No acute cardiopulmonary disease. Right Port-A-Cath tip at low right atrium. If cavoatrial junction position is desired, retraction of approximately 4 cm suggested. Electronically Signed   By: Abigail Miyamoto M.D.   On: 09/18/2019 10:44    ASSESSMENT: Stage IV colon cancer, admitted with concern of sepsis.  PLAN:    1. Stage IV colon cancer: Patient reports she underwent surgery approximately 1 year ago and then was initiated on adjuvant chemotherapy using FOLFOX through May 2021. Treatment was then discontinued secondary to severe side effects. More recently patient reports she was reinitiated on treatment using Erbitux with plan of adding in oral chemotherapy, possibly Xeloda. After lengthy discussion with the patient, she is unclear if she wishes to pursue any additional treatments and is considering hospice. No oncology intervention is needed  while inpatient. Palliative care has been consulted to initiate discussions. Upon discharge, patient will return back to [facility] to further discuss goals of care with her primary oncologist. 2. Possible GI bleed: Appreciate GI input. Possible EGD today. Patient's hemoglobin has improved with 1 unit of packed red blood cells. Continue to monitor and transfuse if hemoglobin falls below 7.0. 3. Concern for sepsis: Blood cultures negative to date. Continue current antibiotics.  Appreciate consult, will follow.  Lloyd Huger, MD   09/19/2019 1:01 PM

## 2019-09-19 NOTE — Consult Note (Signed)
[place]  [phone] [phone]   Name: Donna Yang Date: 09/19/2019 MRN: [id number]  DOB: 1940-03-23  Patient Care Team: System, Pcp Not In as PCP - General    REASON FOR CONSULTATION: Donna Yang is a 79 y.o. female with multiple medical problems including stage IV colorectal cancer status post partial colectomy on active systemic chemotherapy, hypertension, diabetes, history of CVA, and CHF, who is admitted to the hospital 09/18/2019 with altered mental status and hematochezia with concern for sepsis.  Palliative care was consulted help address goals.  SOCIAL HISTORY:     reports that she has never smoked. She has never used smokeless tobacco. She reports previous alcohol use. She reports previous drug use.  Patient is divorced.  She is originally from [place] but moved to [place] about 40 years ago.  She still lives in [place] independently.  She has a son who lives near her and another son in [place].  All of patient's siblings are located in [place].  Patient is a retired Marine scientist.  ADVANCE DIRECTIVES:  Reportedly sons are POA.  No ACP documents on file  CODE STATUS: DNR  PAST MEDICAL HISTORY: Past Medical History:  Diagnosis Date   Cancer ([place])    CHF (congestive heart failure) ([place])    Diabetes mellitus without complication ([place])    Hypertension    Stroke ([place])     PAST SURGICAL HISTORY:  Past Surgical History:  Procedure Laterality Date   PARTIAL COLECTOMY      HEMATOLOGY/ONCOLOGY HISTORY:  Oncology History   No history exists.    ALLERGIES:  is allergic to methylprednisolone.  MEDICATIONS:  Current Facility-Administered Medications  Medication Dose Route Frequency Provider Last Rate Last Admin   0.9 %  sodium chloride infusion   Intravenous Continuous Ivor Costa, MD       acetaminophen (TYLENOL) tablet 650 mg  650 mg Oral Q6H PRN Ivor Costa, MD        ceFEPIme (MAXIPIME) 2 g in sodium chloride 0.9 % 100 mL IVPB  2 g Intravenous Q12H Deatra Robinson B, RPH 200 mL/hr at 09/19/19 0845 2 g at 09/19/19 0845   Chlorhexidine Gluconate Cloth 2 % PADS 6 each  6 each Topical Daily Ivor Costa, MD   6 each at 09/19/19 0848   escitalopram (LEXAPRO) tablet 20 mg  20 mg Oral Daily Sharion Settler, NP   20 mg at 09/19/19 0842   HYDROcodone-acetaminophen (NORCO/VICODIN) 5-325 MG per tablet 1 tablet  1 tablet Oral Q6H PRN Sharion Settler, NP   1 tablet at 09/19/19 1458   metroNIDAZOLE (FLAGYL) IVPB 500 mg  500 mg Intravenous Q8H Ivor Costa, MD 100 mL/hr at 09/19/19 1236 500 mg at 09/19/19 1236   morphine 2 MG/ML injection 2 mg  2 mg Intravenous Q4H PRN Sharion Settler, NP       ondansetron [facility]) injection 4 mg  4 mg Intravenous Q8H PRN Ivor Costa, MD   4 mg at 09/18/19 1343   pantoprazole (PROTONIX) injection 40 mg  40 mg Intravenous Q12H Ivor Costa, MD   40 mg at 09/19/19 0842   vancomycin (VANCOREADY) IVPB 500 mg/100 mL  500 mg Intravenous Q12H Deatra Robinson B, RPH   Stopping Infusion hung by another clincian at 09/19/19 0815    VITAL SIGNS: BP (!) 147/73 (BP Location: Left Arm)    Pulse (!) 107    Temp 98 F (36.7 C) (Oral)    Resp 16  Ht '5\' 5"'$  (1.651 m)    Wt 160 lb 4.4 oz (72.7 kg)    SpO2 100%    BMI 26.67 kg/m  Filed Weights   09/18/19 0752 09/18/19 2304  Weight: 146 lb (66.2 kg) 160 lb 4.4 oz (72.7 kg)    Estimated body mass index is 26.67 kg/m as calculated from the following:   Height as of this encounter: $RemoveBeforeD'5\' 5"'[id number]$  (1.651 m).   Weight as of this encounter: 160 lb 4.4 oz (72.7 kg).  LABS: CBC:    Component Value Date/Time   WBC 20.6 (H) 09/19/2019 1354   HGB 9.8 (L) 09/19/2019 1354   HCT 28.7 (L) 09/19/2019 1354   PLT 452 (H) 09/19/2019 1354   MCV 83.7 09/19/2019 1354   NEUTROABS 24.2 (H) 09/18/2019 0813   LYMPHSABS 1.2 09/18/2019 0813   MONOABS 1.0 09/18/2019 0813   EOSABS 0.0 09/18/2019 0813   BASOSABS 0.1  09/18/2019 0813   Comprehensive Metabolic Panel:    Component Value Date/Time   NA 127 (L) 09/19/2019 0423   K 5.0 09/19/2019 0423   CL 89 (L) 09/19/2019 0423   CO2 30 09/19/2019 0423   BUN 64 (H) 09/19/2019 0423   CREATININE 0.67 09/19/2019 0423   GLUCOSE 148 (H) 09/19/2019 0423   CALCIUM 8.6 (L) 09/19/2019 0423   AST 29 09/18/2019 0813   ALT 38 09/18/2019 0813   ALKPHOS 211 (H) 09/18/2019 0813   BILITOT 0.4 09/18/2019 0813   PROT 5.0 (L) 09/18/2019 0813   ALBUMIN 2.0 (L) 09/18/2019 0813    RADIOGRAPHIC STUDIES: CT Head Wo Contrast  Result Date: 09/18/2019 CLINICAL DATA:  Mental status change. EXAM: CT HEAD WITHOUT CONTRAST TECHNIQUE: Contiguous axial images were obtained from the base of the skull through the vertex without intravenous contrast. COMPARISON:  None. FINDINGS: Brain: Age related cerebral atrophy, ventriculomegaly and periventricular white matter disease. No extra-axial fluid collections are identified. No CT findings for acute hemispheric infarction or intracranial hemorrhage. No mass lesions. There is a focal area of encephalomalacia involving the cortical and subcortical region of the right parietal lobe most consistent with an old infarct. The brainstem and cerebellum are normal. Vascular: Scattered vascular calcifications. No aneurysm or hyperdense vessels. Skull: Hyperostosis frontalis interna. No fracture or bone lesion. Sinuses/Orbits: The paranasal sinuses and mastoid air cells are clear. The globes are intact. Other: No scalp lesions or scalp hematoma. IMPRESSION: 1. Age related cerebral atrophy, ventriculomegaly and periventricular white matter disease. 2. Remote right parietal lobe infarct. 3. No acute intracranial findings or mass lesions. Electronically Signed   By: Marijo Sanes M.D.   On: 09/18/2019 12:15   CT Abdomen Pelvis W Contrast  Result Date: 09/18/2019 CLINICAL DATA:  Abdominal pain and nausea. History of colon cancer on chemotherapy. EXAM: CT  ABDOMEN AND PELVIS WITH CONTRAST TECHNIQUE: Multidetector CT imaging of the abdomen and pelvis was performed using the standard protocol following bolus administration of intravenous contrast. CONTRAST:  175mL OMNIPAQUE IOHEXOL 300 MG/ML  SOLN COMPARISON:  None. FINDINGS: Lower chest: Small left pleural effusion is noted with overlying atelectasis. No worrisome pulmonary nodules. The heart is within normal limits in size for age. Aortic and three-vessel coronary artery calcifications are noted. Slightly dilated fluid-filled distal esophagus is noted. No obvious hiatal hernia. Hepatobiliary: No intrahepatic hepatic metastatic disease is identified. However findings are consistent with peritoneal surface disease involving the liver. No intrahepatic biliary dilatation. The gallbladder is surgically absent. No common bile duct dilatation. Pancreas: Fairly significant pancreatic atrophy but no mass,  inflammation or ductal dilatation. Spleen: Normal size. No focal lesions. Adrenals/Urinary Tract: There is a solid enhancing left adrenal gland lesion which is indeterminate. Correlation with prior imaging studies is recommended. The right adrenal gland is normal. There are bilateral renal cysts but no worrisome renal lesions or hydronephrosis. The bladder is grossly normal. Stomach/Bowel: The stomach is moderately distended with fluid. I do not see an obvious gastric outlet obstruction. Findings could be due to gastroparesis. The small bowel loops demonstrate scattered fluid and mild bowel wall thickening and enhancement without obvious discrete mass. No obstruction. Anastomosis is noted in the right abdomen like this sided patient's tumor resection. Scattered moderate sigmoid colon diverticulosis without findings for acute diverticulitis. Suspect rectal prolapse. Vascular/Lymphatic: Moderate atherosclerotic calcifications involving the aorta and branch vessels but no aneurysm or dissection. The major venous structures are  patent. Small scattered mesenteric and retroperitoneal lymph nodes but no mass or overt adenopathy. However, there is extensive omental disease. Index mass in the right abdomen adjacent to the inferior aspect of the liver measures 6.2 x 4.0 cm on image number 43 and is directly invading the medial distal tip of the liver. Reproductive: The uterus and ovaries are unremarkable. Other: Moderate to large volume malignant ascites throughout the abdomen and pelvis. Musculoskeletal: No significant bony findings. IMPRESSION: 1. 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 2. Moderate to large volume malignant ascites throughout the abdomen and pelvis. 3. Indeterminate left adrenal gland lesion. Correlation with prior imaging studies is recommended. 4. Distended fluid-filled stomach and small bowel loops could be due to gastroparesis/ileus. 5. Suspect rectal prolapse. 6. Small left pleural effusion with overlying atelectasis. 7. Aortic atherosclerosis. Aortic Atherosclerosis (ICD10-I70.0). Electronically Signed   By: Rudie Meyer M.D.   On: 09/18/2019 12:29   DG Chest Portable 1 View  Result Date: 09/18/2019 CLINICAL DATA:  Weakness.  Colon cancer. EXAM: PORTABLE CHEST 1 VIEW COMPARISON:  None. FINDINGS: Right Port-A-Cath tip at low right atrium. Right rotator cuff repair. Patient rotated right. Midline trachea. Normal heart size. No pleural effusion or pneumothorax. Minimal subsegmental atelectasis in the medial left lower lobe. IMPRESSION: No acute cardiopulmonary disease. Right Port-A-Cath tip at low right atrium. If cavoatrial junction position is desired, retraction of approximately 4 cm suggested. Electronically Signed   By: Jeronimo Greaves M.D.   On: 09/18/2019 10:44    PERFORMANCE STATUS (ECOG) : 3 - Symptomatic, >50% confined to bed  Review of Systems Unless otherwise noted, a complete review of systems is negative.  Physical Exam General: NAD, thin,  frail-appearing Pulmonary: Unlabored Extremities: no edema, no joint deformities Skin: no rashes Neurological: Weakness but otherwise nonfocal  IMPRESSION: I met with patient and then spoke with her sister, Amil Amen by phone.  Patient says that she is feeling better.  She denies any acute symptomatic complaints at present.  Patient says that she has been receiving chemotherapy from an oncologist in [place].  Her abdominal CT here reveals extensive peritoneal carcinomatosis, large volume ascites, indeterminate left adrenal mass, possible ileus, and rectal prolapse.  Reportedly, family received a call from patient's oncologist earlier today who reviewed the CT imaging here and informed them that it was consistent with significant disease progression.  Patient has apparently tolerated chemotherapy poorly and has verbalized a desire to discontinue cancer treatment and pursue supportive/hospice care.  Patient tells me that she feels most likely that she will opt to pursue hospice and we discussed that option in detail.  However, she is still hoping that she  can speak with her oncologist face-to-face before making that final decision.  We discussed the option of patient being followed by palliative care with future transition to hospice following her visit with her oncologist.  However, another option could be to go ahead and refer patient to hospice at home as that would not necessarily exclude follow-up with her physician.   It is unclear today where patient will ultimately settle once she discharges home from the hospital.  Reportedly, her son lives near her in [place] but he works full-time.  Patient says she most likely will move in with her two sisters here in [place].  PLAN: -Recommend best supportive/hospice care -DNR -Will follow   Time Total: 60 minutes  Visit consisted of counseling and education dealing with the complex and emotionally intense issues of symptom management and  palliative care in the setting of serious and potentially life-threatening illness.Greater than 50%  of this time was spent counseling and coordinating care related to the above assessment and plan.  Signed by: Altha Harm, PhD, NP-C

## 2019-09-19 NOTE — Progress Notes (Signed)
Initial Nutrition Assessment  DOCUMENTATION CODES:   Non-severe (moderate) malnutrition in context of chronic illness  INTERVENTION:  Provide Ensure Enlive po BID, each supplement provides 350 kcal and 20 grams of protein.  Pt with poor oral intake and would benefit from nutrient dense supplement. One Ensure Enlive supplement provides 350 kcals, 20 grams protein, and 44-45 grams of carbohydrate vs one Glucerna shake supplement, which provides 220 kcals, 10 grams of protein, and 26 grams of carbohydrate. Given pt's hx of DM, RD will reassess adequacy of PO intake, CBGS, and adjust supplement regimen as appropriate at follow-up.   NUTRITION DIAGNOSIS:   Moderate Malnutrition related to chronic illness (stage IV colon cancer, CHF) as evidenced by moderate fat depletion, moderate muscle depletion.  GOAL:   Patient will meet greater than or equal to 90% of their needs  MONITOR:   PO intake, Supplement acceptance, Labs, Weight trends, I & O's  REASON FOR ASSESSMENT:   Malnutrition Screening Tool    ASSESSMENT:   79 year old female with PMHx of DM, HTN, CHF, hx CVA, stage IV colon cancer s/p partial colectomy s/p adjuvant chemotherapy with FOLFOX now on Erbitux admitted with severe sepsis, acute GI bleed with acute blood loss anemia.   Met with patient at bedside. She reports her appetite has been decreased for a while now related to abdominal pain, N/V, diarrhea. She reports her diarrhea is improved now. Her diet was advanced to heart healthy today and she was able to tolerate some beef and mashed potatoes for lunch. She reports she typically eats 2 small meals per day. Food is prepared by neighbors and friends and is usually a well-balanced meal with meat/protein and sides. She also drinks Ensure/Boost and Body Armor at home. Patient is amenable to drinking Ensure here to help meet calorie/protein needs.   Patient reports her UBW was 175 lbs and that she has lost approximately 25 lbs and  is down to around 150 lbs. She reports weight loss occurred over long period of time but unsure exactly when she started losing weight. No weight history in chart to trend. Currently documented to be 72.7 kg (160.27 lbs).   Medications reviewed and include: Protonix, NS at 75 mL/hr, cefepime, Flagyl, vancomycin.  Labs reviewed: CBG 129, Sodium 127, Chloride 89, BUN 64.  NUTRITION - FOCUSED PHYSICAL EXAM:    Most Recent Value  Orbital Region Moderate depletion  Upper Arm Region Moderate depletion  Thoracic and Lumbar Region Mild depletion  Buccal Region Moderate depletion  Temple Region Moderate depletion  Clavicle Bone Region Moderate depletion  Clavicle and Acromion Bone Region Moderate depletion  Scapular Bone Region Unable to assess  Dorsal Hand Moderate depletion  Patellar Region Moderate depletion  Anterior Thigh Region Moderate depletion  Posterior Calf Region Moderate depletion  Edema (RD Assessment) None  Hair Reviewed  Eyes Reviewed  Mouth Reviewed  Skin Reviewed  Nails Reviewed     Diet Order:   Diet Order            Diet Heart Room service appropriate? Yes; Fluid consistency: Thin  Diet effective now                EDUCATION NEEDS:   No education needs have been identified at this time  Skin:  Skin Assessment: Reviewed RN Assessment  Last BM:  09/18/2019 per chart  Height:   Ht Readings from Last 1 Encounters:  09/18/19 $RemoveB'5\' 5"'YvphZsvZ$  (1.651 m)   Weight:   Wt Readings from Last 1  Encounters:  09/18/19 72.7 kg   BMI:  Body mass index is 26.67 kg/m.  Estimated Nutritional Needs:   Kcal:  1800-2000  Protein:  90-100 grams  Fluid:  1.8 L/day  Jacklynn Barnacle, MS, RD, LDN Pager number available on Amion

## 2019-09-19 NOTE — Progress Notes (Addendum)
PROGRESS NOTE    Donna Yang  ZOX:[id number] DOB: 1940-09-17 DOA: 09/18/2019 PCP: System, Pcp Not In   Chief complaint.  Nausea vomiting and diarrhea. Brief Narrative:  Donna Yang is a 79 y.o. female with medical history significant of colon cancer on chemotherapy (s/p of a partial colectomy), hypertension, diabetes mellitus, stroke, CHF, who presents with generalized weakness, altered mental status, fall, nausea, vomiting, abdominal pain. Patient also has a black stool, hemoglobin dropped down to 26, received blood transfusion.  Patient also met severe sepsis criteria with significant tachycardia, leukocytosis, lactic acidosis of 3.7.  She is giving fluids, she also received antibiotics with cefepime and vancomycin.  CT abdomen/pelvis showed peritoneal carcinomatosis.   9/14.  C. difficile toxin pending.  GI has seen the patient, scheduled for EGD today.  Consult oncology and palliative care.  Continue antibiotics.   Assessment & Plan:   Principal Problem:   Severe sepsis ([place]) Active Problems:   Dark stools   Diabetes mellitus without complication (HCC)   Hypertension   Stroke (HCC)   Colon cancer (HCC)   Generalized weakness   Fall   Hyponatremia   Hyperkalemia   CHF (congestive heart failure) (HCC)   Normocytic anemia   Depression   Acute metabolic encephalopathy   Nausea vomiting and diarrhea   GIB (gastrointestinal bleeding)  #1.  Severe sepsis. Most likely secondary to intra-abdominal process.  Possibility of a C. difficile colitis.  C. difficile toxin pending.  Continue current antibiotics with cefepime, Flagyl and vancomycin. Blood culture is also pending. UA negative.  #2.  Acute GI bleed with acute blood loss anemia. Patient had a black stools, hemoglobin 6 at 6.6.  Received blood transfusion.  GI is seeing patient, continue IV Protonix.  Planning for EGD.  3.  Colon cancer with peritoneal carcinomatosis. Obtain consult from oncology.  Also  obtain palliative care consult.  4.  Essential hypertension. Patient had hypotension without admission.  Blood pressure medicines on hold.  5.  Hyponatremia.   Follow.  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  Sodium still low after giving IV fluid bolus.  6.  Nausea vomiting diarrhea. Check C. difficile toxin.  7.  Chronic congestive heart failure. Most likely diastolic congestive heart failure. No exacerbation.  8.  Acute metabolic encephalopathy. Mental status seem to be better today.     DVT prophylaxis: SCDs Code Status: DNR Family Communication: Updated patient sister.  .   Status is: Inpatient  Remains inpatient appropriate because:Inpatient level of care appropriate due to severity of illness   Dispo: The patient is from: Home              Anticipated d/c is to: home              Anticipated d/c date is: 1 day              Patient currently is not medically stable to d/c.        I/O last 3 completed shifts: In: 1140 [I.V.:50; Blood:690; IV Piggyback:400] Out: 400 [Urine:400] No intake/output data recorded.     Consultants:   Oncology and GI  Procedures: EGD pending  Antimicrobials:  Vancomycin, cefepime and Flagyl.  Subjective: Patient still has some diarrhea, nausea vomiting is better today.  No fever or chills.  No dysuria hematuria.  Denies any short of breath or cough.  Objective: Vitals:   09/18/19 2253 09/18/19 2304 09/18/19 2323 09/19/19 0737  BP: 125/64  118/64 137/68  Pulse: (!) 105  Marland Kitchen)  108 (!) 110  Resp: $Remo'20  16 17  'veFQL$ Temp: 98.1 F (36.7 C)  98.7 F (37.1 C) 97.9 F (36.6 C)  TempSrc: Oral  Oral Oral  SpO2: 99%  97% 100%  Weight:  72.7 kg    Height:  $Remove'5\' 5"'sNLUFFT$  (1.651 m)      Intake/Output Summary (Last 24 hours) at 09/19/2019 1128 Last data filed at 09/19/2019 0200 Gross per 24 hour  Intake 1040 ml  Output 400 ml  Net 640 ml   Filed Weights   09/18/19 0752 09/18/19 2304  Weight: 66.2 kg 72.7 kg    Examination:  General exam:  Appears calm and comfortable  Respiratory system: Clear to auscultation. Respiratory effort normal. Cardiovascular system: S1 & S2 heard, RRR. No JVD, murmurs, rubs, gallops or clicks. No pedal edema. Gastrointestinal system: Abdomen is mildly distended, soft and nontender. No organomegaly or masses felt. Normal bowel sounds heard. Central nervous system: Alert and oriented x2. No focal neurological deficits. Extremities: Symmetric  Skin: No rashes, lesions or ulcers Psychiatry: Mood & affect appropriate.     Data Reviewed: I have personally reviewed following labs and imaging studies  CBC: Recent Labs  Lab 09/18/19 0813 09/18/19 1417 09/19/19 0018 09/19/19 0911  WBC 26.7* 19.0* 18.5* 20.1*  NEUTROABS 24.2*  --   --   --   HGB 8.7* 6.6* 9.4* 9.3*  HCT 25.3* 19.4* 26.7* 27.0*  MCV 85.2 85.5 82.9 83.1  PLT 641* 416* 400 811*   Basic Metabolic Panel: Recent Labs  Lab 09/18/19 0937 09/18/19 1417 09/19/19 0018 09/19/19 0423  NA 124* 125* 127* 127*  K 5.4* 5.4* 5.2* 5.0  CL 90* 88* 90* 89*  CO2 $Re'27 27 28 30  'zrz$ GLUCOSE 167* 156* 146* 148*  BUN 54* 52* 62* 64*  CREATININE 0.59 0.60 0.59 0.67  CALCIUM 8.5* 8.3* 8.5* 8.6*  MG 1.9  --   --   --    GFR: Estimated Creatinine Clearance: 58.8 mL/min (by C-G formula based on SCr of 0.67 mg/dL). Liver Function Tests: Recent Labs  Lab 09/18/19 0813  AST 29  ALT 38  ALKPHOS 211*  BILITOT 0.4  PROT 5.0*  ALBUMIN 2.0*   No results for input(s): LIPASE, AMYLASE in the last 168 hours. Recent Labs  Lab 09/18/19 1417  AMMONIA 24   Coagulation Profile: Recent Labs  Lab 09/18/19 0937  INR 1.1   Cardiac Enzymes: No results for input(s): CKTOTAL, CKMB, CKMBINDEX, TROPONINI in the last 168 hours. BNP (last 3 results) No results for input(s): PROBNP in the last 8760 hours. HbA1C: No results for input(s): HGBA1C in the last 72 hours. CBG: Recent Labs  Lab 09/19/19 0738  GLUCAP 129*   Lipid Profile: No results for  input(s): CHOL, HDL, LDLCALC, TRIG, CHOLHDL, LDLDIRECT in the last 72 hours. Thyroid Function Tests: No results for input(s): TSH, T4TOTAL, FREET4, T3FREE, THYROIDAB in the last 72 hours. Anemia Panel: No results for input(s): VITAMINB12, FOLATE, FERRITIN, TIBC, IRON, RETICCTPCT in the last 72 hours. Sepsis Labs: Recent Labs  Lab 09/18/19 0850 09/18/19 1216 09/18/19 1417 09/19/19 0018  PROCALCITON  --   --   --  0.73  LATICACIDVEN 2.2* 3.7* 1.8  --     Recent Results (from the past 240 hour(s))  Culture, blood (routine x 2)     Status: None (Preliminary result)   Collection Time: 09/18/19  9:39 AM   Specimen: BLOOD  Result Value Ref Range Status   Specimen Description BLOOD BLOOD LEFT HAND  Final   Special Requests   Final    BOTTLES DRAWN AEROBIC AND ANAEROBIC Blood Culture adequate volume   Culture   Final    NO GROWTH < 24 HOURS Performed at [facility], [address] [place], [place] [address]    Report Status PENDING  Incomplete  Culture, blood (routine x 2)     Status: None (Preliminary result)   Collection Time: 09/18/19  9:39 AM   Specimen: BLOOD  Result Value Ref Range Status   Specimen Description BLOOD RIGHT PORT  Final   Special Requests   Final    BOTTLES DRAWN AEROBIC AND ANAEROBIC Blood Culture adequate volume   Culture   Final    NO GROWTH < 24 HOURS Performed at [facility], [address], [place], [place] [address]    Report Status PENDING  Incomplete  SARS Coronavirus 2 by RT PCR (hospital order, performed in [place] hospital lab) Nasopharyngeal Nasopharyngeal Swab     Status: None   Collection Time: 09/18/19 11:22 AM   Specimen: Nasopharyngeal Swab  Result Value Ref Range Status   SARS Coronavirus 2 NEGATIVE NEGATIVE Final    Comment: (NOTE) SARS-CoV-2 target nucleic acids are NOT DETECTED.  The SARS-CoV-2 RNA is generally detectable in upper and lower respiratory specimens during the acute phase of infection. The  lowest concentration of SARS-CoV-2 viral copies this assay can detect is 250 copies / mL. A negative result does not preclude SARS-CoV-2 infection and should not be used as the sole basis for treatment or other patient management decisions.  A negative result may occur with improper specimen collection / handling, submission of specimen other than nasopharyngeal swab, presence of viral mutation(s) within the areas targeted by this assay, and inadequate number of viral copies (<250 copies / mL). A negative result must be combined with clinical observations, patient history, and epidemiological information.  Fact Sheet for Patients:   StrictlyIdeas.no  Fact Sheet for Healthcare Providers: BankingDealers.co.za  This test is not yet approved or  cleared by the [place] FDA and has been authorized for detection and/or diagnosis of SARS-CoV-2 by FDA under an Emergency Use Authorization (EUA).  This EUA will remain in effect (meaning this test can be used) for the duration of the COVID-19 declaration under Section 564(b)(1) of the Act, 21 U.S.C. section 360bbb-3(b)(1), unless the authorization is terminated or revoked sooner.  Performed at [facility], [address] [place], [place] [address]          Radiology Studies: CT Head Wo Contrast  Result Date: 09/18/2019 CLINICAL DATA:  Mental status change. EXAM: CT HEAD WITHOUT CONTRAST TECHNIQUE: Contiguous axial images were obtained from the base of the skull through the vertex without intravenous contrast. COMPARISON:  None. FINDINGS: Brain: Age related cerebral atrophy, ventriculomegaly and periventricular white matter disease. No extra-axial fluid collections are identified. No CT findings for acute hemispheric infarction or intracranial hemorrhage. No mass lesions. There is a focal area of encephalomalacia involving the cortical and subcortical region of the right parietal  lobe most consistent with an old infarct. The brainstem and cerebellum are normal. Vascular: Scattered vascular calcifications. No aneurysm or hyperdense vessels. Skull: Hyperostosis frontalis interna. No fracture or bone lesion. Sinuses/Orbits: The paranasal sinuses and mastoid air cells are clear. The globes are intact. Other: No scalp lesions or scalp hematoma. IMPRESSION: 1. Age related cerebral atrophy, ventriculomegaly and periventricular white matter disease. 2. Remote right parietal lobe infarct. 3. No acute intracranial findings or mass  lesions. Electronically Signed   By: Marijo Sanes M.D.   On: 09/18/2019 12:15   CT Abdomen Pelvis W Contrast  Result Date: 09/18/2019 CLINICAL DATA:  Abdominal pain and nausea. History of colon cancer on chemotherapy. EXAM: CT ABDOMEN AND PELVIS WITH CONTRAST TECHNIQUE: Multidetector CT imaging of the abdomen and pelvis was performed using the standard protocol following bolus administration of intravenous contrast. CONTRAST:  173mL OMNIPAQUE IOHEXOL 300 MG/ML  SOLN COMPARISON:  None. FINDINGS: Lower chest: Small left pleural effusion is noted with overlying atelectasis. No worrisome pulmonary nodules. The heart is within normal limits in size for age. Aortic and three-vessel coronary artery calcifications are noted. Slightly dilated fluid-filled distal esophagus is noted. No obvious hiatal hernia. Hepatobiliary: No intrahepatic hepatic metastatic disease is identified. However findings are consistent with peritoneal surface disease involving the liver. No intrahepatic biliary dilatation. The gallbladder is surgically absent. No common bile duct dilatation. Pancreas: Fairly significant pancreatic atrophy but no mass, inflammation or ductal dilatation. Spleen: Normal size. No focal lesions. Adrenals/Urinary Tract: There is a solid enhancing left adrenal gland lesion which is indeterminate. Correlation with prior imaging studies is recommended. The right adrenal gland is  normal. There are bilateral renal cysts but no worrisome renal lesions or hydronephrosis. The bladder is grossly normal. Stomach/Bowel: The stomach is moderately distended with fluid. I do not see an obvious gastric outlet obstruction. Findings could be due to gastroparesis. The small bowel loops demonstrate scattered fluid and mild bowel wall thickening and enhancement without obvious discrete mass. No obstruction. Anastomosis is noted in the right abdomen like this sided patient's tumor resection. Scattered moderate sigmoid colon diverticulosis without findings for acute diverticulitis. Suspect rectal prolapse. Vascular/Lymphatic: Moderate atherosclerotic calcifications involving the aorta and branch vessels but no aneurysm or dissection. The major venous structures are patent. Small scattered mesenteric and retroperitoneal lymph nodes but no mass or overt adenopathy. However, there is extensive omental disease. Index mass in the right abdomen adjacent to the inferior aspect of the liver measures 6.2 x 4.0 cm on image number 43 and is directly invading the medial distal tip of the liver. Reproductive: The uterus and ovaries are unremarkable. Other: Moderate to large volume malignant ascites throughout the abdomen and pelvis. Musculoskeletal: No significant bony findings. IMPRESSION: 1. 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 2. Moderate to large volume malignant ascites throughout the abdomen and pelvis. 3. Indeterminate left adrenal gland lesion. Correlation with prior imaging studies is recommended. 4. Distended fluid-filled stomach and small bowel loops could be due to gastroparesis/ileus. 5. Suspect rectal prolapse. 6. Small left pleural effusion with overlying atelectasis. 7. Aortic atherosclerosis. Aortic Atherosclerosis (ICD10-I70.0). Electronically Signed   By: Marijo Sanes M.D.   On: 09/18/2019 12:29   DG Chest Portable 1 View  Result Date:  09/18/2019 CLINICAL DATA:  Weakness.  Colon cancer. EXAM: PORTABLE CHEST 1 VIEW COMPARISON:  None. FINDINGS: Right Port-A-Cath tip at low right atrium. Right rotator cuff repair. Patient rotated right. Midline trachea. Normal heart size. No pleural effusion or pneumothorax. Minimal subsegmental atelectasis in the medial left lower lobe. IMPRESSION: No acute cardiopulmonary disease. Right Port-A-Cath tip at low right atrium. If cavoatrial junction position is desired, retraction of approximately 4 cm suggested. Electronically Signed   By: Abigail Miyamoto M.D.   On: 09/18/2019 10:44        Scheduled Meds: . Chlorhexidine Gluconate Cloth  6 each Topical Daily  . escitalopram  20 mg Oral Daily  . pantoprazole (PROTONIX) IV  40 mg Intravenous Q12H   Continuous Infusions: . sodium chloride    . ceFEPime (MAXIPIME) IV 2 g (09/19/19 0845)  . metronidazole 500 mg (09/19/19 0356)  . vancomycin 500 mg (09/19/19 0706)     LOS: 1 day    Time spent: 28 minutes    Sharen Hones, MD [facility]   To contact the attending provider between 7A-7P or the covering provider during after hours 7P-7A, please log into the web site [email or web address] and access using universal [place] password for that web site. If you do not have the password, please call the hospital operator.  09/19/2019, 11:28 AM

## 2019-09-20 ENCOUNTER — Inpatient Hospital Stay: Payer: Medicare HMO

## 2019-09-20 DIAGNOSIS — E440 Moderate protein-calorie malnutrition: Secondary | ICD-10-CM | POA: Insufficient documentation

## 2019-09-20 DIAGNOSIS — R140 Abdominal distension (gaseous): Secondary | ICD-10-CM

## 2019-09-20 LAB — CBC
HCT: 22.8 % — ABNORMAL LOW (ref 36.0–46.0)
HCT: 24.7 % — ABNORMAL LOW (ref 36.0–46.0)
Hemoglobin: 7.8 g/dL — ABNORMAL LOW (ref 12.0–15.0)
Hemoglobin: 8.6 g/dL — ABNORMAL LOW (ref 12.0–15.0)
MCH: 29 pg (ref 26.0–34.0)
MCH: 29 pg (ref 26.0–34.0)
MCHC: 34.2 g/dL (ref 30.0–36.0)
MCHC: 34.8 g/dL (ref 30.0–36.0)
MCV: 84.8 fL (ref 80.0–100.0)
MCV: 83.2 fL (ref 80.0–100.0)
Platelets: 382 10*3/uL (ref 150–400)
Platelets: 406 10*3/uL — ABNORMAL HIGH (ref 150–400)
RBC: 2.69 MIL/uL — ABNORMAL LOW (ref 3.87–5.11)
RBC: 2.97 MIL/uL — ABNORMAL LOW (ref 3.87–5.11)
RDW: 14.3 % (ref 11.5–15.5)
RDW: 14.5 % (ref 11.5–15.5)
WBC: 13.4 10*3/uL — ABNORMAL HIGH (ref 4.0–10.5)
WBC: 12.9 10*3/uL — ABNORMAL HIGH (ref 4.0–10.5)
nRBC: 0 % (ref 0.0–0.2)
nRBC: 0 % (ref 0.0–0.2)

## 2019-09-20 LAB — BODY FLUID CELL COUNT WITH DIFFERENTIAL
Eos, Fluid: 0 %
Lymphs, Fluid: 47 %
Monocyte-Macrophage-Serous Fluid: 15 %
Neutrophil Count, Fluid: 38 %
Total Nucleated Cell Count, Fluid: 1176 cu mm

## 2019-09-20 LAB — BASIC METABOLIC PANEL
Anion gap: 8 (ref 5–15)
BUN: 39 mg/dL — ABNORMAL HIGH (ref 8–23)
CO2: 27 mmol/L (ref 22–32)
Calcium: 8.3 mg/dL — ABNORMAL LOW (ref 8.9–10.3)
Chloride: 94 mmol/L — ABNORMAL LOW (ref 98–111)
Creatinine, Ser: 0.62 mg/dL (ref 0.44–1.00)
GFR calc Af Amer: >60 mL/min (ref >60)
GFR calc non Af Amer: >60 mL/min (ref >60)
Glucose, Bld: 116 mg/dL — ABNORMAL HIGH (ref 70–99)
Potassium: 4.3 mmol/L (ref 3.5–5.1)
Sodium: 129 mmol/L — ABNORMAL LOW (ref 135–145)

## 2019-09-20 LAB — GLUCOSE, CAPILLARY: Glucose-Capillary: 121 mg/dL — ABNORMAL HIGH (ref 70–99)

## 2019-09-20 LAB — MAGNESIUM: Magnesium: 1.8 mg/dL (ref 1.7–2.4)

## 2019-09-20 LAB — ALBUMIN, PLEURAL OR PERITONEAL FLUID: Albumin, Fluid: 1.6 g/dL

## 2019-09-20 NOTE — Procedures (Signed)
Pre Procedural Dx: Symptomatic Ascites Post Procedural Dx: Same  Successful US guided paracentesis yielding 3 L of serous ascitic fluid. Sample sent to laboratory as requested.  EBL: None  Complications: None immediate  Jay Gabriella Woodhead, MD Pager #: [phone]   

## 2019-09-20 NOTE — Progress Notes (Signed)
Donna Yang , MD [address] [place], [place], [place], [address] [address], [place], [place], [place], [address] Phone: [phone]  Fax: Donna Yang is being followed for GI bleed day 2 of follow up   Subjective:  Denies any bleeding , complains of abdominal distension/pain   Objective: Vital signs in last 24 hours: Vitals:   09/19/19 0737 09/19/19 1532 09/19/19 2355 09/20/19 0723  BP: 137/68 (!) 147/73 110/66 (!) 148/71  Pulse: (!) 110 (!) 107 (!) 101 96  Resp: 17 16 16 15   Temp: 97.9 F (36.6 C) 98 F (36.7 C) 97.6 F (36.4 C) 98.2 F (36.8 C)  TempSrc: Oral Oral Oral Oral  SpO2: 100% 100% 99% 99%  Weight:      Height:       Weight change:   Intake/Output Summary (Last 24 hours) at 09/20/2019 0847 Last data filed at 09/20/2019 0125 Gross per 24 hour  Intake 500 ml  Output 500 ml  Net 0 ml     Exam:  Abdomen: abdominal distension +, free fluid present, no tenderness BS+   Lab Results: @LABTEST2 @ Micro Results: Recent Results (from the past 240 hour(s))  Culture, blood (routine x 2)     Status: None (Preliminary result)   Collection Time: 09/18/19  9:39 AM   Specimen: BLOOD  Result Value Ref Range Status   Specimen Description BLOOD BLOOD LEFT HAND  Final   Special Requests   Final    BOTTLES DRAWN AEROBIC AND ANAEROBIC Blood Culture adequate volume   Culture   Final    NO GROWTH 2 DAYS Performed at [facility], [address] [place], [place] [address]    Report Status PENDING  Incomplete  Culture, blood (routine x 2)     Status: None (Preliminary result)   Collection Time: 09/18/19  9:39 AM   Specimen: BLOOD  Result Value Ref Range Status   Specimen Description BLOOD RIGHT PORT  Final   Special Requests   Final    BOTTLES DRAWN AEROBIC AND ANAEROBIC Blood Culture adequate volume   Culture   Final    NO GROWTH 2 DAYS Performed at [facility], [address] [place], [place] [address]     Report Status PENDING  Incomplete  SARS Coronavirus 2 by RT PCR (hospital order, performed in [place] hospital lab) Nasopharyngeal Nasopharyngeal Swab     Status: None   Collection Time: 09/18/19 11:22 AM   Specimen: Nasopharyngeal Swab  Result Value Ref Range Status   SARS Coronavirus 2 NEGATIVE NEGATIVE Final    Comment: (NOTE) SARS-CoV-2 target nucleic acids are NOT DETECTED.  The SARS-CoV-2 RNA is generally detectable in upper and lower respiratory specimens during the acute phase of infection. The lowest concentration of SARS-CoV-2 viral copies this assay can detect is 250 copies / mL. A negative result does not preclude SARS-CoV-2 infection and should not be used as the sole basis for treatment or other patient management decisions.  A negative result may occur with improper specimen collection / handling, submission of specimen other than nasopharyngeal swab, presence of viral mutation(s) within the areas targeted by this assay, and inadequate number of viral copies (<250 copies / mL). A negative result must be combined with clinical observations, patient history, and epidemiological information.  Fact Sheet for Patients:   StrictlyIdeas.no  Fact Sheet for Healthcare Providers: BankingDealers.co.za  This test is not yet approved or  cleared by the [place] FDA and  has been authorized for detection and/or diagnosis of SARS-CoV-2 by FDA under an Emergency Use Authorization (EUA).  This EUA will remain in effect (meaning this test can be used) for the duration of the COVID-19 declaration under Section 564(b)(1) of the Act, 21 U.S.C. section 360bbb-3(b)(1), unless the authorization is terminated or revoked sooner.  Performed at [facility], [address] [place], [place] [address]    Studies/Results: CT Head Wo Contrast  Result Date: 09/18/2019 CLINICAL DATA:  Mental status change. EXAM: CT HEAD  WITHOUT CONTRAST TECHNIQUE: Contiguous axial images were obtained from the base of the skull through the vertex without intravenous contrast. COMPARISON:  None. FINDINGS: Brain: Age related cerebral atrophy, ventriculomegaly and periventricular white matter disease. No extra-axial fluid collections are identified. No CT findings for acute hemispheric infarction or intracranial hemorrhage. No mass lesions. There is a focal area of encephalomalacia involving the cortical and subcortical region of the right parietal lobe most consistent with an old infarct. The brainstem and cerebellum are normal. Vascular: Scattered vascular calcifications. No aneurysm or hyperdense vessels. Skull: Hyperostosis frontalis interna. No fracture or bone lesion. Sinuses/Orbits: The paranasal sinuses and mastoid air cells are clear. The globes are intact. Other: No scalp lesions or scalp hematoma. IMPRESSION: 1. Age related cerebral atrophy, ventriculomegaly and periventricular white matter disease. 2. Remote right parietal lobe infarct. 3. No acute intracranial findings or mass lesions. Electronically Signed   By: Marijo Sanes M.D.   On: 09/18/2019 12:15   CT Abdomen Pelvis W Contrast  Result Date: 09/18/2019 CLINICAL DATA:  Abdominal pain and nausea. History of colon cancer on chemotherapy. EXAM: CT ABDOMEN AND PELVIS WITH CONTRAST TECHNIQUE: Multidetector CT imaging of the abdomen and pelvis was performed using the standard protocol following bolus administration of intravenous contrast. CONTRAST:  139mL OMNIPAQUE IOHEXOL 300 MG/ML  SOLN COMPARISON:  None. FINDINGS: Lower chest: Small left pleural effusion is noted with overlying atelectasis. No worrisome pulmonary nodules. The heart is within normal limits in size for age. Aortic and three-vessel coronary artery calcifications are noted. Slightly dilated fluid-filled distal esophagus is noted. No obvious hiatal hernia. Hepatobiliary: No intrahepatic hepatic metastatic disease is  identified. However findings are consistent with peritoneal surface disease involving the liver. No intrahepatic biliary dilatation. The gallbladder is surgically absent. No common bile duct dilatation. Pancreas: Fairly significant pancreatic atrophy but no mass, inflammation or ductal dilatation. Spleen: Normal size. No focal lesions. Adrenals/Urinary Tract: There is a solid enhancing left adrenal gland lesion which is indeterminate. Correlation with prior imaging studies is recommended. The right adrenal gland is normal. There are bilateral renal cysts but no worrisome renal lesions or hydronephrosis. The bladder is grossly normal. Stomach/Bowel: The stomach is moderately distended with fluid. I do not see an obvious gastric outlet obstruction. Findings could be due to gastroparesis. The small bowel loops demonstrate scattered fluid and mild bowel wall thickening and enhancement without obvious discrete mass. No obstruction. Anastomosis is noted in the right abdomen like this sided patient's tumor resection. Scattered moderate sigmoid colon diverticulosis without findings for acute diverticulitis. Suspect rectal prolapse. Vascular/Lymphatic: Moderate atherosclerotic calcifications involving the aorta and branch vessels but no aneurysm or dissection. The major venous structures are patent. Small scattered mesenteric and retroperitoneal lymph nodes but no mass or overt adenopathy. However, there is extensive omental disease. Index mass in the right abdomen adjacent to the inferior aspect of the liver measures 6.2 x 4.0 cm on image number 43 and is directly invading the medial distal tip of the  liver. Reproductive: The uterus and ovaries are unremarkable. Other: Moderate to large volume malignant ascites throughout the abdomen and pelvis. Musculoskeletal: No significant bony findings. IMPRESSION: 1. 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 2. Moderate to large volume  malignant ascites throughout the abdomen and pelvis. 3. Indeterminate left adrenal gland lesion. Correlation with prior imaging studies is recommended. 4. Distended fluid-filled stomach and small bowel loops could be due to gastroparesis/ileus. 5. Suspect rectal prolapse. 6. Small left pleural effusion with overlying atelectasis. 7. Aortic atherosclerosis. Aortic Atherosclerosis (ICD10-I70.0). Electronically Signed   By: Marijo Sanes M.D.   On: 09/18/2019 12:29   DG Chest Portable 1 View  Result Date: 09/18/2019 CLINICAL DATA:  Weakness.  Colon cancer. EXAM: PORTABLE CHEST 1 VIEW COMPARISON:  None. FINDINGS: Right Port-A-Cath tip at low right atrium. Right rotator cuff repair. Patient rotated right. Midline trachea. Normal heart size. No pleural effusion or pneumothorax. Minimal subsegmental atelectasis in the medial left lower lobe. IMPRESSION: No acute cardiopulmonary disease. Right Port-A-Cath tip at low right atrium. If cavoatrial junction position is desired, retraction of approximately 4 cm suggested. Electronically Signed   By: Abigail Miyamoto M.D.   On: 09/18/2019 10:44   Medications: I have reviewed the patient's current medications. Scheduled Meds: . [START ON 09/21/2019] atorvastatin  40 mg Oral Q T,Th,S,Su-1800  . atorvastatin  80 mg Oral Q M,W,F-1800  . busPIRone  5 mg Oral BID  . Chlorhexidine Gluconate Cloth  6 each Topical Daily  . escitalopram  20 mg Oral Daily  . feeding supplement (ENSURE ENLIVE)  237 mL Oral BID BM  . nortriptyline  30 mg Oral QHS  . pantoprazole (PROTONIX) IV  40 mg Intravenous Q12H   Continuous Infusions: . sodium chloride    . ceFEPime (MAXIPIME) IV Stopped (09/19/19 2324)  . metronidazole 500 mg (09/20/19 0442)  . vancomycin 500 mg (09/20/19 0628)   PRN Meds:.acetaminophen, butalbital-acetaminophen-caffeine, diphenoxylate-atropine, HYDROcodone-acetaminophen, HYDROcodone-acetaminophen, morphine injection, ondansetron (ZOFRAN) IV,  zolpidem   Assessment: Principal Problem:   Severe sepsis (HCC) Active Problems:   Dark stools   Diabetes mellitus without complication (HCC)   Hypertension   Stroke ([place])   Colon cancer (HCC)   Generalized weakness   Fall   Hyponatremia   Hyperkalemia   CHF (congestive heart failure) (HCC)   Normocytic anemia   Depression   Acute metabolic encephalopathy   Nausea vomiting and diarrhea   GIB (gastrointestinal bleeding)  Donna Yang is a 79 y.o. y/o female with recent history of colorectal cancer undergoing partial colectomy in December 2020 and underwent chemotherapy complicated by diarrhea which had to be stopped.  Admitted with weakness, dark-colored stools. Drop in hemoglobin since admission. CT scan of the abdomen demonstrates large volume ascites and peritoneal carcinomatosis. In addition I ileus and distention of the stomach and small bowel noted.  It appears that her malignancy has progressed as per notes from palliative care and Dr. Grayland Ormond.  Hemoglobin stable today at 8.6 g no rise in BUN/creatinine ratio.  Still appears to be a bit dehydrated with a low chloride and low sodium  Plan 1. Monitor CBC and transfuse as needed 2. At this point is not possible to do an upper endoscopy with the ileus and distended stomach.  If she is subjected to anesthesia it is possible that she may aspirate.  It is possible that the ileus could be from malignant partial obstruction.. 3.  Discussed options with the patient she has not decided whether she wants endoscopy  at this point of time.   Her main issue is abdominal distension which is possible secondary to ascites +/- ileus, no vomiting at this time . No further melena.rectal bleeding. I will order a therapeutic paracentesis .   LOS: 2 days   Donna Bellows, MD 09/20/2019, 8:47 AM

## 2019-09-20 NOTE — Plan of Care (Signed)

## 2019-09-20 NOTE — Progress Notes (Signed)
PROGRESS NOTE    Donna Yang  KGM:[id number] DOB: 1940/05/15 DOA: 09/18/2019 PCP: Pcp, No    Brief Narrative:  Briscoe Burns a 79 y.o.femalewith medical history significant ofcolon cancer on chemotherapy (s/p of a partial colectomy), hypertension, diabetes mellitus, stroke, CHF, who presents with generalized weakness,altered mental status,fall, nausea, vomiting, abdominal pain. Patient also has a black stool, hemoglobin dropped down to 26, received blood transfusion.  Patient also met severe sepsis criteria with significant tachycardia, leukocytosis, lactic acidosis of 3.7.  She is giving fluids, she also received antibiotics with cefepime and vancomycin.  CT abdomen/pelvis showed peritoneal carcinomatosis.   9/14.  C. difficile toxin pending.  GI has seen the patient, scheduled for EGD today.  Consult oncology and palliative care.  Continue antibiotics.  9/15.No EGD with ileus and ascitis. S/p therapeutic Paracentesis today 3 L removed. Pt hasnt decided if wants EGD.    Consultants:   GI, IR, oncology  Procedures: Paracentesis-9/15  Antimicrobials:   Flagyl and vancomycin   Subjective: Patient has no complaints.  Sister at bedside.  She states she has not decided whether she wants hospice or not.  Objective: Vitals:   09/20/19 0723 09/20/19 1522 09/20/19 1547 09/20/19 1619  BP: (!) 148/71 135/66 130/60 (!) 134/56  Pulse: 96 100 95 92  Resp: $Remo'15 18 18 17  'FPpGS$ Temp: 98.2 F (36.8 C)   98.6 F (37 C)  TempSrc: Oral   Oral  SpO2: 99% 98% 96% 100%  Weight:      Height:        Intake/Output Summary (Last 24 hours) at 09/20/2019 1742 Last data filed at 09/20/2019 0125 Gross per 24 hour  Intake --  Output 500 ml  Net -500 ml   Filed Weights   09/18/19 0752 09/18/19 2304  Weight: 66.2 kg 72.7 kg    Examination:  General exam: Appears calm and comfortable  Respiratory system: Clear to auscultation. Respiratory effort normal. Cardiovascular system:  S1 & S2 heard, RRR. No JVD, murmurs, rubs, gallops or clicks. No pedal edema. Gastrointestinal system: Abdomen is mildly distended soft and nontender. Normal bowel sounds heard. Central nervous system: Alert and oriented.  Grossly intact Extremities: No edema Skin: Warm dry Psychiatry: Judgement and insight appear normal. Mood & affect appropriate.     Data Reviewed: I have personally reviewed following labs and imaging studies  CBC: Recent Labs  Lab 09/18/19 0813 09/18/19 1417 09/19/19 0911 09/19/19 1354 09/19/19 1922 09/20/19 0203 09/20/19 0556  WBC 26.7*   < > 20.1* 20.6* 20.8* 13.4* 12.9*  NEUTROABS 24.2*  --   --   --   --   --   --   HGB 8.7*   < > 9.3* 9.8* 9.0* 7.8* 8.6*  HCT 25.3*   < > 27.0* 28.7* 26.3* 22.8* 24.7*  MCV 85.2   < > 83.1 83.7 84.3 84.8 83.2  PLT 641*   < > 413* 452* 416* 382 406*   < > = values in this interval not displayed.   Basic Metabolic Panel: Recent Labs  Lab 09/18/19 0937 09/18/19 1417 09/19/19 0018 09/19/19 0423 09/20/19 0500  NA 124* 125* 127* 127* 129*  K 5.4* 5.4* 5.2* 5.0 4.3  CL 90* 88* 90* 89* 94*  CO2 $Re'27 27 28 30 27  'Lxt$ GLUCOSE 167* 156* 146* 148* 116*  BUN 54* 52* 62* 64* 39*  CREATININE 0.59 0.60 0.59 0.67 0.62  CALCIUM 8.5* 8.3* 8.5* 8.6* 8.3*  MG 1.9  --   --   --  1.8   GFR: Estimated Creatinine Clearance: 58.8 mL/min (by C-G formula based on SCr of 0.62 mg/dL). Liver Function Tests: Recent Labs  Lab 09/18/19 0813  AST 29  ALT 38  ALKPHOS 211*  BILITOT 0.4  PROT 5.0*  ALBUMIN 2.0*   No results for input(s): LIPASE, AMYLASE in the last 168 hours. Recent Labs  Lab 09/18/19 1417  AMMONIA 24   Coagulation Profile: Recent Labs  Lab 09/18/19 0937  INR 1.1   Cardiac Enzymes: No results for input(s): CKTOTAL, CKMB, CKMBINDEX, TROPONINI in the last 168 hours. BNP (last 3 results) No results for input(s): PROBNP in the last 8760 hours. HbA1C: No results for input(s): HGBA1C in the last 72  hours. CBG: Recent Labs  Lab 09/19/19 0738 09/20/19 0725  GLUCAP 129* 121*   Lipid Profile: No results for input(s): CHOL, HDL, LDLCALC, TRIG, CHOLHDL, LDLDIRECT in the last 72 hours. Thyroid Function Tests: No results for input(s): TSH, T4TOTAL, FREET4, T3FREE, THYROIDAB in the last 72 hours. Anemia Panel: No results for input(s): VITAMINB12, FOLATE, FERRITIN, TIBC, IRON, RETICCTPCT in the last 72 hours. Sepsis Labs: Recent Labs  Lab 09/18/19 0850 09/18/19 1216 09/18/19 1417 09/19/19 0018  PROCALCITON  --   --   --  0.73  LATICACIDVEN 2.2* 3.7* 1.8  --     Recent Results (from the past 240 hour(s))  Culture, blood (routine x 2)     Status: None (Preliminary result)   Collection Time: 09/18/19  9:39 AM   Specimen: BLOOD  Result Value Ref Range Status   Specimen Description BLOOD BLOOD LEFT HAND  Final   Special Requests   Final    BOTTLES DRAWN AEROBIC AND ANAEROBIC Blood Culture adequate volume   Culture   Final    NO GROWTH 2 DAYS Performed at [facility], [address] [place], [place] [address]    Report Status PENDING  Incomplete  Culture, blood (routine x 2)     Status: None (Preliminary result)   Collection Time: 09/18/19  9:39 AM   Specimen: BLOOD  Result Value Ref Range Status   Specimen Description BLOOD RIGHT PORT  Final   Special Requests   Final    BOTTLES DRAWN AEROBIC AND ANAEROBIC Blood Culture adequate volume   Culture   Final    NO GROWTH 2 DAYS Performed at [facility], [address], [place], [place] [address]    Report Status PENDING  Incomplete  SARS Coronavirus 2 by RT PCR (hospital order, performed in [place] hospital lab) Nasopharyngeal Nasopharyngeal Swab     Status: None   Collection Time: 09/18/19 11:22 AM   Specimen: Nasopharyngeal Swab  Result Value Ref Range Status   SARS Coronavirus 2 NEGATIVE NEGATIVE Final    Comment: (NOTE) SARS-CoV-2 target nucleic acids are NOT DETECTED.  The SARS-CoV-2 RNA is  generally detectable in upper and lower respiratory specimens during the acute phase of infection. The lowest concentration of SARS-CoV-2 viral copies this assay can detect is 250 copies / mL. A negative result does not preclude SARS-CoV-2 infection and should not be used as the sole basis for treatment or other patient management decisions.  A negative result may occur with improper specimen collection / handling, submission of specimen other than nasopharyngeal swab, presence of viral mutation(s) within the areas targeted by this assay, and inadequate number of viral copies (<250 copies / mL). A negative result must be combined with clinical observations, patient history, and epidemiological information.  Fact Sheet for  Patients:   StrictlyIdeas.no  Fact Sheet for Healthcare Providers: BankingDealers.co.za  This test is not yet approved or  cleared by the [place] FDA and has been authorized for detection and/or diagnosis of SARS-CoV-2 by FDA under an Emergency Use Authorization (EUA).  This EUA will remain in effect (meaning this test can be used) for the duration of the COVID-19 declaration under Section 564(b)(1) of the Act, 21 U.S.C. section 360bbb-3(b)(1), unless the authorization is terminated or revoked sooner.  Performed at [facility], [address] [place], [place] [address]          Radiology Studies: US Paracentesis  Result Date: 09/20/2019 INDICATION: History of colon cancer, now with symptomatic intra-abdominal ascites. Please perform ultrasound-guided paracentesis for diagnostic and therapeutic purposes. EXAM: ULTRASOUND-GUIDED PARACENTESIS COMPARISON:  None. MEDICATIONS: None. COMPLICATIONS: None immediate. TECHNIQUE: Informed written consent was obtained from the patient after a discussion of the risks, benefits and alternatives to treatment. A timeout was performed prior to the initiation of the  procedure. Initial ultrasound scanning demonstrates a moderate amount of ascites within the right lower abdomen which was subsequently prepped and draped in the usual sterile fashion. 1% lidocaine with epinephrine was used for local anesthesia. Under direct ultrasound guidance, an 8 Fr Safe-T-Centesis catheter was introduced. The paracentesis was performed. The catheter was removed and a dressing was applied. The patient tolerated the procedure well without immediate post procedural complication. FINDINGS: A total of approximately 3 liters of serous fluid was removed. Samples were sent to the laboratory as requested by the clinical team. IMPRESSION: Successful ultrasound-guided paracentesis yielding 3 liters of peritoneal fluid. Electronically Signed   By: Sandi Mariscal M.D.   On: 09/20/2019 16:47        Scheduled Meds: . [START ON 09/21/2019] atorvastatin  40 mg Oral Q T,Th,S,Su-1800  . atorvastatin  80 mg Oral Q M,W,F-1800  . busPIRone  5 mg Oral BID  . Chlorhexidine Gluconate Cloth  6 each Topical Daily  . escitalopram  20 mg Oral Daily  . feeding supplement (ENSURE ENLIVE)  237 mL Oral BID BM  . nortriptyline  30 mg Oral QHS  . pantoprazole (PROTONIX) IV  40 mg Intravenous Q12H   Continuous Infusions: . sodium chloride    . ceFEPime (MAXIPIME) IV 2 g (09/20/19 0942)  . metronidazole 500 mg (09/20/19 1405)  . vancomycin 500 mg (09/20/19 1718)    Assessment & Plan:   Principal Problem:   Severe sepsis ([place]) Active Problems:   Dark stools   Diabetes mellitus without complication (HCC)   Hypertension   Stroke (HCC)   Colon cancer (HCC)   Generalized weakness   Fall   Hyponatremia   Hyperkalemia   CHF (congestive heart failure) (HCC)   Normocytic anemia   Depression   Acute metabolic encephalopathy   Nausea vomiting and diarrhea   GIB (gastrointestinal bleeding)   Malnutrition of moderate degree  #1.  Severe sepsis on admission- with leukocytosis, tachycardia, initially  hypotensive.  Lactic acid elevated.  Had altered mental status.  Source of infection unclear.  Chest x-ray negative.. CT with malignant ascitis, ileus/gastroparesis.  C. difficile toxin pending.    Hemodynamically stable now Leukocytosis improving, afebrile continue with cefepime, Flagyl, vancomycin  Blood cultures pending we will follow up  UA negative     #2.  Acute GI bleed with acute blood loss anemia. Patient had a black stools, hemoglobin 6 at 6.6.  Received 1 unit PRBC  Continue IV Protonix  GI following, EGD on  hold as not sure if patient wants it also difficult with her ileus  GI is seeing patient, continue IV Protonix.  Planning for EGD. Continue to monitor, transfuse if hemoglobin less than 7  3.  Colon cancer with peritoneal carcinomatosis. Oncology following, no oncologic intervention is needed while inpatient.  Palliative care consulted.  Patient has not decided whether she wants to be hospice yet   4.  Essential hypertension. Stable we will continue to monitor  5.  Hyponatremia.    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 dehydration. Was given ivf NA improving today, 129. Continue to monitor   6.  Nausea vomiting diarrhea. Check C. difficile toxin.pending No diarrhea reported.   7.  Chronic congestive heart failure. Most likely diastolic congestive heart failure. No exacerbation.  8.  Acute metabolic encephalopathy. Improved.    DVT prophylaxis: S CD  Code Status: DNR Family Communication:sister At bedside  Status is: Inpatient  Remains inpatient appropriate because:IV treatments appropriate due to intensity of illness or inability to take PO   Dispo: The patient is from: Home              Anticipated d/c is to: Home              Anticipated d/c date is: 2 days              Patient currently is not medically stable to d/c.            LOS: 2 days   Time spent: 35 min with >50% on coc    Nolberto Hanlon, MD [facility] Pager  [phone]  If 7PM-7AM, please contact night-coverage [email or web address] Password [facility] 09/20/2019, 5:42 PM

## 2019-09-21 DIAGNOSIS — Z515 Encounter for palliative care: Secondary | ICD-10-CM

## 2019-09-21 LAB — CBC
HCT: 24.2 % — ABNORMAL LOW (ref 36.0–46.0)
Hemoglobin: 8.1 g/dL — ABNORMAL LOW (ref 12.0–15.0)
MCH: 28.5 pg (ref 26.0–34.0)
MCHC: 33.5 g/dL (ref 30.0–36.0)
MCV: 85.2 fL (ref 80.0–100.0)
Platelets: 381 10*3/uL (ref 150–400)
RBC: 2.84 MIL/uL — ABNORMAL LOW (ref 3.87–5.11)
RDW: 14.2 % (ref 11.5–15.5)
WBC: 10.1 10*3/uL (ref 4.0–10.5)
nRBC: 0 % (ref 0.0–0.2)

## 2019-09-21 LAB — BASIC METABOLIC PANEL
Anion gap: 5 (ref 5–15)
BUN: 26 mg/dL — ABNORMAL HIGH (ref 8–23)
CO2: 27 mmol/L (ref 22–32)
Calcium: 7.7 mg/dL — ABNORMAL LOW (ref 8.9–10.3)
Chloride: 96 mmol/L — ABNORMAL LOW (ref 98–111)
Creatinine, Ser: 0.45 mg/dL (ref 0.44–1.00)
GFR calc Af Amer: >60 mL/min (ref >60)
GFR calc non Af Amer: >60 mL/min (ref >60)
Glucose, Bld: 124 mg/dL — ABNORMAL HIGH (ref 70–99)
Potassium: 4.2 mmol/L (ref 3.5–5.1)
Sodium: 128 mmol/L — ABNORMAL LOW (ref 135–145)

## 2019-09-21 MED ORDER — HEPARIN SOD (PORK) LOCK FLUSH 100 UNIT/ML IV SOLN
500.0000 [IU] | Freq: Once | INTRAVENOUS | Status: AC
  Administered 2019-09-21: 500 [IU] via INTRAVENOUS
  Filled 2019-09-21: qty 5

## 2019-09-21 MED ORDER — HEPARIN SOD (PORK) LOCK FLUSH 10 UNIT/ML IV SOLN: 10.0000 [IU] | Freq: Once | INTRAVENOUS | Status: DC

## 2019-09-21 MED ORDER — CEFDINIR 300 MG PO CAPS
300.0000 mg | ORAL_CAPSULE | Freq: Two times a day (BID) | ORAL | Status: DC
  Administered 2019-09-21: 300 mg via ORAL
  Filled 2019-09-21 (×2): qty 1

## 2019-09-21 MED ORDER — LACTINEX PO CHEW: 1.0000 | CHEWABLE_TABLET | Freq: Three times a day (TID) | ORAL | 0 refills | Status: AC

## 2019-09-21 MED ORDER — ACETAMINOPHEN 325 MG PO TABS: 650.0000 mg | ORAL_TABLET | Freq: Four times a day (QID) | ORAL | Status: AC | PRN

## 2019-09-21 MED ORDER — CHLORHEXIDINE GLUCONATE CLOTH 2 % EX PADS
6.0000 | MEDICATED_PAD | Freq: Every day | CUTANEOUS | Status: DC
  Administered 2019-09-21: 6 via TOPICAL

## 2019-09-21 MED ORDER — CEFDINIR 300 MG PO CAPS: 300.0000 mg | ORAL_CAPSULE | Freq: Two times a day (BID) | ORAL | 0 refills | Status: AC

## 2019-09-21 NOTE — Progress Notes (Signed)
[place]  [phone] [phone]   Name: Donna Yang Date: 09/21/2019 MRN: [id number]  DOB: [birth year]  Patient Care Team: Pcp, No as PCP - General    REASON FOR CONSULTATION: Donna Yang is a 79 y.o. female with multiple medical problems including stage IV colorectal cancer status post partial colectomy on active systemic chemotherapy, hypertension, diabetes, history of CVA, and CHF, who is admitted to the hospital 09/18/2019 with altered mental status and hematochezia with concern for sepsis.  Palliative care was consulted help address goals.  CODE STATUS: DNR  PAST MEDICAL HISTORY: Past Medical History:  Diagnosis Date  . Cancer ([place])   . CHF (congestive heart failure) ([place])   . Diabetes mellitus without complication ([place])   . Hypertension   . Stroke [facility])     PAST SURGICAL HISTORY:  Past Surgical History:  Procedure Laterality Date  . PARTIAL COLECTOMY      HEMATOLOGY/ONCOLOGY HISTORY:  Oncology History   No history exists.    ALLERGIES:  is allergic to methylprednisolone.  MEDICATIONS:  Current Facility-Administered Medications  Medication Dose Route Frequency Provider Last Rate Last Admin  . acetaminophen (TYLENOL) tablet 650 mg  650 mg Oral Q6H PRN Ivor Costa, MD      . atorvastatin (LIPITOR) tablet 40 mg  40 mg Oral Q T,Th,S,Su-1800 Hall, Scott A, RPH      . atorvastatin (LIPITOR) tablet 80 mg  80 mg Oral Q M,W,F-1800 Hart Robinsons A, RPH   80 mg at 09/20/19 1707  . busPIRone (BUSPAR) tablet 5 mg  5 mg Oral BID Ivor Costa, MD   5 mg at 09/21/19 1012  . butalbital-acetaminophen-caffeine (FIORICET) 50-325-40 MG per tablet 1 tablet  1 tablet Oral Q6H PRN Ivor Costa, MD      . cefdinir (OMNICEF) capsule 300 mg  300 mg Oral Q12H Nolberto Hanlon, MD   300 mg at 09/21/19 1404  . Chlorhexidine Gluconate Cloth 2 % PADS 6 each  6 each Topical Daily Ivor Costa, MD   6 each at 09/21/19 1207   . Chlorhexidine Gluconate Cloth 2 % PADS 6 each  6 each Topical Q0600 Nolberto Hanlon, MD   6 each at 09/21/19 [id number]  . diphenoxylate-atropine (LOMOTIL) 2.5-0.025 MG per tablet 1-2 tablet  1-2 tablet Oral Q6H PRN Ivor Costa, MD      . escitalopram (LEXAPRO) tablet 20 mg  20 mg Oral Daily Sharion Settler, NP   20 mg at 09/21/19 1009  . feeding supplement (ENSURE ENLIVE) (ENSURE ENLIVE) liquid 237 mL  237 mL Oral BID BM Sharen Hones, MD   237 mL at 09/21/19 1017  . HYDROcodone-acetaminophen (NORCO/VICODIN) 5-325 MG per tablet 1 tablet  1 tablet Oral Q6H PRN Ivor Costa, MD      . HYDROcodone-acetaminophen (NORCO/VICODIN) 5-325 MG per tablet 1 tablet  1 tablet Oral Q6H PRN Sharion Settler, NP   1 tablet at 09/21/19 1009  . metroNIDAZOLE (FLAGYL) IVPB 500 mg  500 mg Intravenous Cleophas Dunker, MD 100 mL/hr at 09/21/19 1233 500 mg at 09/21/19 1233  . morphine 2 MG/ML injection 2 mg  2 mg Intravenous Q4H PRN Sharion Settler, NP   2 mg at 09/20/19 2241  . nortriptyline (PAMELOR) capsule 30 mg  30 mg Oral QHS Ivor Costa, MD   30 mg at 09/20/19 2026  . ondansetron (ZOFRAN) injection 4 mg  4 mg Intravenous Q8H PRN Ivor Costa, MD   4 mg at 09/18/19 1343  .  pantoprazole (PROTONIX) injection 40 mg  40 mg Intravenous Q12H Ivor Costa, MD   40 mg at 09/21/19 1012  . vancomycin (VANCOREADY) IVPB 500 mg/100 mL  500 mg Intravenous Q12H Benn Moulder, RPH 100 mL/hr at 09/21/19 0605 500 mg at 09/21/19 9622  . zolpidem (AMBIEN) tablet 5 mg  5 mg Oral QHS PRN Sharion Settler, NP   5 mg at 09/20/19 2025    VITAL SIGNS: BP 125/64 (BP Location: Left Arm)   Pulse 98   Temp (!) 97.3 F (36.3 C) (Oral)   Resp 17   Ht $R'5\' 5"'wc$  (1.651 m)   Wt 160 lb 4.4 oz (72.7 kg)   SpO2 98%   BMI 26.67 kg/m  Filed Weights   09/18/19 0752 09/18/19 2304  Weight: 146 lb (66.2 kg) 160 lb 4.4 oz (72.7 kg)    Estimated body mass index is 26.67 kg/m as calculated from the following:   Height as of this encounter: $RemoveBeforeD'5\' 5"'AmZvATouOiyvoc$  (1.651  m).   Weight as of this encounter: 160 lb 4.4 oz (72.7 kg).  LABS: CBC:    Component Value Date/Time   WBC 10.1 09/21/2019 0556   HGB 8.1 (L) 09/21/2019 0556   HCT 24.2 (L) 09/21/2019 0556   PLT 381 09/21/2019 0556   MCV 85.2 09/21/2019 0556   NEUTROABS 24.2 (H) 09/18/2019 0813   LYMPHSABS 1.2 09/18/2019 0813   MONOABS 1.0 09/18/2019 0813   EOSABS 0.0 09/18/2019 0813   BASOSABS 0.1 09/18/2019 0813   Comprehensive Metabolic Panel:    Component Value Date/Time   NA 128 (L) 09/21/2019 0556   K 4.2 09/21/2019 0556   CL 96 (L) 09/21/2019 0556   CO2 27 09/21/2019 0556   BUN 26 (H) 09/21/2019 0556   CREATININE 0.45 09/21/2019 0556   GLUCOSE 124 (H) 09/21/2019 0556   CALCIUM 7.7 (L) 09/21/2019 0556   AST 29 09/18/2019 0813   ALT 38 09/18/2019 0813   ALKPHOS 211 (H) 09/18/2019 0813   BILITOT 0.4 09/18/2019 0813   PROT 5.0 (L) 09/18/2019 0813   ALBUMIN 2.0 (L) 09/18/2019 0813    RADIOGRAPHIC STUDIES: CT Head Wo Contrast  Result Date: 09/18/2019 CLINICAL DATA:  Mental status change. EXAM: CT HEAD WITHOUT CONTRAST TECHNIQUE: Contiguous axial images were obtained from the base of the skull through the vertex without intravenous contrast. COMPARISON:  None. FINDINGS: Brain: Age related cerebral atrophy, ventriculomegaly and periventricular white matter disease. No extra-axial fluid collections are identified. No CT findings for acute hemispheric infarction or intracranial hemorrhage. No mass lesions. There is a focal area of encephalomalacia involving the cortical and subcortical region of the right parietal lobe most consistent with an old infarct. The brainstem and cerebellum are normal. Vascular: Scattered vascular calcifications. No aneurysm or hyperdense vessels. Skull: Hyperostosis frontalis interna. No fracture or bone lesion. Sinuses/Orbits: The paranasal sinuses and mastoid air cells are clear. The globes are intact. Other: No scalp lesions or scalp hematoma. IMPRESSION: 1. Age  related cerebral atrophy, ventriculomegaly and periventricular white matter disease. 2. Remote right parietal lobe infarct. 3. No acute intracranial findings or mass lesions. Electronically Signed   By: Marijo Sanes M.D.   On: 09/18/2019 12:15   CT Abdomen Pelvis W Contrast  Result Date: 09/18/2019 CLINICAL DATA:  Abdominal pain and nausea. History of colon cancer on chemotherapy. EXAM: CT ABDOMEN AND PELVIS WITH CONTRAST TECHNIQUE: Multidetector CT imaging of the abdomen and pelvis was performed using the standard protocol following bolus administration of intravenous contrast. CONTRAST:  165mL  OMNIPAQUE IOHEXOL 300 MG/ML  SOLN COMPARISON:  None. FINDINGS: Lower chest: Small left pleural effusion is noted with overlying atelectasis. No worrisome pulmonary nodules. The heart is within normal limits in size for age. Aortic and three-vessel coronary artery calcifications are noted. Slightly dilated fluid-filled distal esophagus is noted. No obvious hiatal hernia. Hepatobiliary: No intrahepatic hepatic metastatic disease is identified. However findings are consistent with peritoneal surface disease involving the liver. No intrahepatic biliary dilatation. The gallbladder is surgically absent. No common bile duct dilatation. Pancreas: Fairly significant pancreatic atrophy but no mass, inflammation or ductal dilatation. Spleen: Normal size. No focal lesions. Adrenals/Urinary Tract: There is a solid enhancing left adrenal gland lesion which is indeterminate. Correlation with prior imaging studies is recommended. The right adrenal gland is normal. There are bilateral renal cysts but no worrisome renal lesions or hydronephrosis. The bladder is grossly normal. Stomach/Bowel: The stomach is moderately distended with fluid. I do not see an obvious gastric outlet obstruction. Findings could be due to gastroparesis. The small bowel loops demonstrate scattered fluid and mild bowel wall thickening and enhancement without  obvious discrete mass. No obstruction. Anastomosis is noted in the right abdomen like this sided patient's tumor resection. Scattered moderate sigmoid colon diverticulosis without findings for acute diverticulitis. Suspect rectal prolapse. Vascular/Lymphatic: Moderate atherosclerotic calcifications involving the aorta and branch vessels but no aneurysm or dissection. The major venous structures are patent. Small scattered mesenteric and retroperitoneal lymph nodes but no mass or overt adenopathy. However, there is extensive omental disease. Index mass in the right abdomen adjacent to the inferior aspect of the liver measures 6.2 x 4.0 cm on image number 43 and is directly invading the medial distal tip of the liver. Reproductive: The uterus and ovaries are unremarkable. Other: Moderate to large volume malignant ascites throughout the abdomen and pelvis. Musculoskeletal: No significant bony findings. IMPRESSION: 1. 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 2. Moderate to large volume malignant ascites throughout the abdomen and pelvis. 3. Indeterminate left adrenal gland lesion. Correlation with prior imaging studies is recommended. 4. Distended fluid-filled stomach and small bowel loops could be due to gastroparesis/ileus. 5. Suspect rectal prolapse. 6. Small left pleural effusion with overlying atelectasis. 7. Aortic atherosclerosis. Aortic Atherosclerosis (ICD10-I70.0). Electronically Signed   By: Marijo Sanes M.D.   On: 09/18/2019 12:29   US Paracentesis  Result Date: 09/20/2019 INDICATION: History of colon cancer, now with symptomatic intra-abdominal ascites. Please perform ultrasound-guided paracentesis for diagnostic and therapeutic purposes. EXAM: ULTRASOUND-GUIDED PARACENTESIS COMPARISON:  None. MEDICATIONS: None. COMPLICATIONS: None immediate. TECHNIQUE: Informed written consent was obtained from the patient after a discussion of the risks, benefits and  alternatives to treatment. A timeout was performed prior to the initiation of the procedure. Initial ultrasound scanning demonstrates a moderate amount of ascites within the right lower abdomen which was subsequently prepped and draped in the usual sterile fashion. 1% lidocaine with epinephrine was used for local anesthesia. Under direct ultrasound guidance, an 8 Fr Safe-T-Centesis catheter was introduced. The paracentesis was performed. The catheter was removed and a dressing was applied. The patient tolerated the procedure well without immediate post procedural complication. FINDINGS: A total of approximately 3 liters of serous fluid was removed. Samples were sent to the laboratory as requested by the clinical team. IMPRESSION: Successful ultrasound-guided paracentesis yielding 3 liters of peritoneal fluid. Electronically Signed   By: Sandi Mariscal M.D.   On: 09/20/2019 16:47   DG Chest Portable 1 View  Result Date: 09/18/2019 CLINICAL DATA:  Weakness.  Colon cancer. EXAM: PORTABLE CHEST 1 VIEW COMPARISON:  None. FINDINGS: Right Port-A-Cath tip at low right atrium. Right rotator cuff repair. Patient rotated right. Midline trachea. Normal heart size. No pleural effusion or pneumothorax. Minimal subsegmental atelectasis in the medial left lower lobe. IMPRESSION: No acute cardiopulmonary disease. Right Port-A-Cath tip at low right atrium. If cavoatrial junction position is desired, retraction of approximately 4 cm suggested. Electronically Signed   By: Abigail Miyamoto M.D.   On: 09/18/2019 10:44    PERFORMANCE STATUS (ECOG) : 3 - Symptomatic, >50% confined to bed  Review of Systems Unless otherwise noted, a complete review of systems is negative.  Physical Exam General: NAD, thin, frail-appearing Pulmonary: Unlabored Extremities: no edema, no joint deformities Skin: no rashes Neurological: Weakness but otherwise nonfocal  IMPRESSION: Patient seen for follow-up.  She is currently sitting in the chair.   Says she feels generally poor but does not elucidate on specifics.  Patient is status post paracentesis yesterday with 3 L ascitic fluid removed.  Met with patient and sister.  Patient says that she is opted to forego EGD.  She again confirms that it is her desire to not pursue further treatment for cancer.  Plan is for her to stay with her sisters following discharge from hospital.  Patient would be interested in pursuing hospice care at home.  I sent a referral to [facility]. Dr. Grayland Ormond has kindly agreed to serve as the hospice attending.   PLAN: -Best supportive care -Hospice referral at home -DNR/DNI -Will follow   Time Total: 25 minutes  Visit consisted of counseling and education dealing with the complex and emotionally intense issues of symptom management and palliative care in the setting of serious and potentially life-threatening illness.Greater than 50%  of this time was spent counseling and coordinating care related to the above assessment and plan.  Signed by: Altha Harm, PhD, NP-C

## 2019-09-21 NOTE — Plan of Care (Signed)

## 2019-09-21 NOTE — Care Management Important Message (Signed)
Important Message  Patient Details  Name: Donna Yang MRN: [id number] Date of Birth: 09/24/1940   Medicare Important Message Given:  Yes     Dannette Barbara 09/21/2019, 1:07 PM

## 2019-09-21 NOTE — Discharge Instructions (Signed)
Make sure have pcp or your  oncologist ask for records for her final fluid result Make appointment with your Primary care physician as soon as possible

## 2019-09-21 NOTE — Progress Notes (Signed)
[facility] Liaison note:  New referral for [facility] hospice services at home received from Palliative NP [place]. TOC Misty [place]  aware.Patient information given to referral. Hospice eligibility pending.  Writer met in the room with patient Donna Yang her sister Donna Yang to initiate education regarding hospice services, philosophy and team approach to care with understanding voiced. Questions answered. No DME needs at this time.  Plan is for discharge home today via [facility]. Signed our of facility DNR in place to accompany at discharge. Thank you for the opportunity to be involved in the care of this patient and her family. Flo Shanks BSN, RN, [place] [phone]

## 2019-09-21 NOTE — Progress Notes (Signed)
Patients port a cath deaccessed by Coralyn Mark, RN report given to EMS, patient transported via stretcher to her sister's residence

## 2019-09-21 NOTE — Discharge Summary (Signed)
Donna Yang HKV:[id number] DOB: [birth year] DOA: 09/18/2019  PCP: Pcp, No  Admit date: 09/18/2019 Discharge date: 09/21/2019  Admitted From: home Disposition:  home  Recommendations for Outpatient Follow-up:  1. Follow up with PCP in 1 week 2. Please obtain BMP/CBC in one week 3.f/u with Dr. Vicente Males in 2-3 days if still in [place], otherwise with primary oncologist.    Discharge Condition:Stable CODE STATUS:DNR  Diet recommendation: Heart Healthy  Brief/Interim Summary: Donna Yang is a 79 y.o. female with medical history significant of colon cancer on chemotherapy (s/p of a partial colectomy), hypertension, diabetes mellitus, stroke, CHF, who presents with generalized weakness, altered mental status, fall, nausea, vomiting, abdominal pain.  She has poor appetite and decreased oral intake.  She has nausea, vomiting with nonbilious nonbloody vomitus, diffuse abdominal pain.  Patient has chronic intermittent diarrhea.  Does not seem to have fever or chills.Patient was noted to have dark stool bowel movement in the ED.Patient was initially hypotensive with blood pressure 79/52, which improved to 135/73 after giving 2 L lactated Ringer solution in ED. pt was found to have WBC 26.3, lactic acid 2.2 -->3.7, ammonia 24,  INR 1.1, negative Covid PCR, potassium 5.4, renal function okay, sodium 124, temperature 97.3, tachycardia with heart rate of 105, RR 18, oxygen saturation 96% on room air.  Chest x-ray negative.  CT head is negative for acute intracranial abnormalities. Pt is admitted to [place] bed as inpatient. She had a CT of abdomen and pelvis with results below.  Her diarrhea resolved while she was in the hospital.  She was started on empiric antibiotic.  #1. Severe sepsis on admission- with leukocytosis, tachycardia, initially hypotensive.  Lactic acid elevated.  Had altered mental status.  Source of infection unclear. S/p paracentesis with fluid result consistent with continuous  bacterial peritonitis.  Chest x-ray negative.. CT with malignant ascitis, ileus/gastroparesis.  Diarrhea resolved. Hemodynamically stable now Afebrile, leukocytosis improved We will discharge with Omnicef to complete course for SBP needs follow-up in 2 to 3 days continue with cefepime, Flagyl, vancomycin  Final cultures pending UA negative     #2. Acute GI bleed with acute blood loss anemia. Patient had a black stools, hemoglobin 6 at 6.6. Received 1 unit PRBC  When started on IV PPI. GI was consulted, patient did not want EGD She did have ascites on exam and she underwent paracentesis via IR with 3 L removed on 09/20/2019   3. Colon cancer with peritoneal carcinomatosis. Oncology following, no oncologic intervention is needed while inpatient.  Palliative care consulted.  Patient has not decided whether she wants to be hospice yet Will need to follow-up with her primary oncologist upon discharge   4. Essential hypertension. Stable  5. Hyponatremia.  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 dehydration. Was given ivf Sodium improved, sodium today 128 from initial 125 Continue to monitor closely as outpatient   6. Nausea vomiting diarrhea. None while in-house resolved No diarrhea reported.   7. Chronic congestive heart failure. Most likely diastolic congestive heart failure. No exacerbation.  8. Acute metabolic encephalopathy. Improved.   Discharge Diagnoses:  Principal Problem:   Severe sepsis ([place]) Active Problems:   Dark stools   Diabetes mellitus without complication (HCC)   Hypertension   Stroke (HCC)   Colon cancer (HCC)   Generalized weakness   Fall   Hyponatremia   Hyperkalemia   CHF (congestive heart failure) (HCC)   Normocytic anemia   Depression   Acute metabolic encephalopathy   Nausea vomiting and  diarrhea   GIB (gastrointestinal bleeding)   Malnutrition of moderate degree    Discharge Instructions  Discharge Instructions     Call MD for:  temperature >100.4   Complete by: As directed    Diet - low sodium heart healthy   Complete by: As directed    Discharge instructions   Complete by: As directed    Follow-up with your primary oncologist within 1 week Follow-up with your primary care within 3 days need blood work   Increase activity slowly   Complete by: As directed      Allergies as of 09/21/2019      Reactions   Methylprednisolone    Other reaction(s): Migraine      Medication List    STOP taking these medications   diphenoxylate-atropine 2.5-0.025 MG tablet Commonly known as: LOMOTIL   HYDROcodone-acetaminophen 5-325 MG tablet Commonly known as: NORCO/VICODIN   potassium chloride 20 MEQ packet Commonly known as: KLOR-CON     TAKE these medications   acetaminophen 325 MG tablet Commonly known as: TYLENOL Take 2 tablets (650 mg total) by mouth every 6 (six) hours as needed for fever, headache or moderate pain.   atorvastatin 80 MG tablet Commonly known as: LIPITOR Take 40-80 mg by mouth daily. Take one tablet (80 mg) by mouth on Mon, Wed, Fri. Take one-half tablet (40 mg) on Tues, Thurs, Sat.   busPIRone 5 MG tablet Commonly known as: BUSPAR Take 5 mg by mouth 2 (two) times daily.   butalbital-acetaminophen-caffeine 50-325-40 MG tablet Commonly known as: FIORICET Take by mouth 2 (two) times daily as needed for headache.   cefdinir 300 MG capsule Commonly known as: OMNICEF Take 1 capsule (300 mg total) by mouth every 12 (twelve) hours for 7 days.   ergocalciferol 1.25 MG (50000 UT) capsule Commonly known as: VITAMIN D2 Take 50,000 Units by mouth once a week.   escitalopram 20 MG tablet Commonly known as: LEXAPRO Take 20 mg by mouth daily.   hydrOXYzine 25 MG capsule Commonly known as: VISTARIL Take 25-50 mg by mouth 3 (three) times daily as needed for anxiety (sleep).   lactobacillus acidophilus & bulgar chewable tablet Chew 1 tablet by mouth 3 (three) times daily with  meals for 7 days.   nortriptyline 10 MG capsule Commonly known as: PAMELOR Take 30 mg by mouth at bedtime.   omeprazole 40 MG capsule Commonly known as: PRILOSEC Take 40 mg by mouth daily.   ondansetron 8 MG tablet Commonly known as: ZOFRAN Take 8 mg by mouth every 8 (eight) hours as needed for nausea or vomiting.   zolpidem 10 MG tablet Commonly known as: AMBIEN Take 10 mg by mouth at bedtime.       Follow-up Information    [name], MD Follow up in 2 day(s).   Specialty: Gastroenterology Contact information: [place] [id number] [phone]              Allergies  Allergen Reactions  . Methylprednisolone     Other reaction(s): Migraine    Consultations:  IR, GI, palliative   Procedures/Studies: CT Head Wo Contrast  Result Date: 09/18/2019 CLINICAL DATA:  Mental status change. EXAM: CT HEAD WITHOUT CONTRAST TECHNIQUE: Contiguous axial images were obtained from the base of the skull through the vertex without intravenous contrast. COMPARISON:  None. FINDINGS: Brain: Age related cerebral atrophy, ventriculomegaly and periventricular white matter disease. No extra-axial fluid collections are identified. No CT findings for acute hemispheric infarction or  intracranial hemorrhage. No mass lesions. There is a focal area of encephalomalacia involving the cortical and subcortical region of the right parietal lobe most consistent with an old infarct. The brainstem and cerebellum are normal. Vascular: Scattered vascular calcifications. No aneurysm or hyperdense vessels. Skull: Hyperostosis frontalis interna. No fracture or bone lesion. Sinuses/Orbits: The paranasal sinuses and mastoid air cells are clear. The globes are intact. Other: No scalp lesions or scalp hematoma. IMPRESSION: 1. Age related cerebral atrophy, ventriculomegaly and periventricular white matter disease. 2. Remote right parietal lobe infarct. 3. No acute intracranial findings  or mass lesions. Electronically Signed   By: Marijo Sanes M.D.   On: 09/18/2019 12:15   CT Abdomen Pelvis W Contrast  Result Date: 09/18/2019 CLINICAL DATA:  Abdominal pain and nausea. History of colon cancer on chemotherapy. EXAM: CT ABDOMEN AND PELVIS WITH CONTRAST TECHNIQUE: Multidetector CT imaging of the abdomen and pelvis was performed using the standard protocol following bolus administration of intravenous contrast. CONTRAST:  158mL OMNIPAQUE IOHEXOL 300 MG/ML  SOLN COMPARISON:  None. FINDINGS: Lower chest: Small left pleural effusion is noted with overlying atelectasis. No worrisome pulmonary nodules. The heart is within normal limits in size for age. Aortic and three-vessel coronary artery calcifications are noted. Slightly dilated fluid-filled distal esophagus is noted. No obvious hiatal hernia. Hepatobiliary: No intrahepatic hepatic metastatic disease is identified. However findings are consistent with peritoneal surface disease involving the liver. No intrahepatic biliary dilatation. The gallbladder is surgically absent. No common bile duct dilatation. Pancreas: Fairly significant pancreatic atrophy but no mass, inflammation or ductal dilatation. Spleen: Normal size. No focal lesions. Adrenals/Urinary Tract: There is a solid enhancing left adrenal gland lesion which is indeterminate. Correlation with prior imaging studies is recommended. The right adrenal gland is normal. There are bilateral renal cysts but no worrisome renal lesions or hydronephrosis. The bladder is grossly normal. Stomach/Bowel: The stomach is moderately distended with fluid. I do not see an obvious gastric outlet obstruction. Findings could be due to gastroparesis. The small bowel loops demonstrate scattered fluid and mild bowel wall thickening and enhancement without obvious discrete mass. No obstruction. Anastomosis is noted in the right abdomen like this sided patient's tumor resection. Scattered moderate sigmoid colon  diverticulosis without findings for acute diverticulitis. Suspect rectal prolapse. Vascular/Lymphatic: Moderate atherosclerotic calcifications involving the aorta and branch vessels but no aneurysm or dissection. The major venous structures are patent. Small scattered mesenteric and retroperitoneal lymph nodes but no mass or overt adenopathy. However, there is extensive omental disease. Index mass in the right abdomen adjacent to the inferior aspect of the liver measures 6.2 x 4.0 cm on image number 43 and is directly invading the medial distal tip of the liver. Reproductive: The uterus and ovaries are unremarkable. Other: Moderate to large volume malignant ascites throughout the abdomen and pelvis. Musculoskeletal: No significant bony findings. IMPRESSION: 1. 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 2. Moderate to large volume malignant ascites throughout the abdomen and pelvis. 3. Indeterminate left adrenal gland lesion. Correlation with prior imaging studies is recommended. 4. Distended fluid-filled stomach and small bowel loops could be due to gastroparesis/ileus. 5. Suspect rectal prolapse. 6. Small left pleural effusion with overlying atelectasis. 7. Aortic atherosclerosis. Aortic Atherosclerosis (ICD10-I70.0). Electronically Signed   By: Marijo Sanes M.D.   On: 09/18/2019 12:29   US Paracentesis  Result Date: 09/20/2019 INDICATION: History of colon cancer, now with symptomatic intra-abdominal ascites. Please perform ultrasound-guided paracentesis for diagnostic and therapeutic purposes. EXAM: ULTRASOUND-GUIDED PARACENTESIS COMPARISON:  None. MEDICATIONS: None. COMPLICATIONS: None immediate. TECHNIQUE: Informed written consent was obtained from the patient after a discussion of the risks, benefits and alternatives to treatment. A timeout was performed prior to the initiation of the procedure. Initial ultrasound scanning demonstrates a moderate amount of  ascites within the right lower abdomen which was subsequently prepped and draped in the usual sterile fashion. 1% lidocaine with epinephrine was used for local anesthesia. Under direct ultrasound guidance, an 8 Fr Safe-T-Centesis catheter was introduced. The paracentesis was performed. The catheter was removed and a dressing was applied. The patient tolerated the procedure well without immediate post procedural complication. FINDINGS: A total of approximately 3 liters of serous fluid was removed. Samples were sent to the laboratory as requested by the clinical team. IMPRESSION: Successful ultrasound-guided paracentesis yielding 3 liters of peritoneal fluid. Electronically Signed   By: Sandi Mariscal M.D.   On: 09/20/2019 16:47   DG Chest Portable 1 View  Result Date: 09/18/2019 CLINICAL DATA:  Weakness.  Colon cancer. EXAM: PORTABLE CHEST 1 VIEW COMPARISON:  None. FINDINGS: Right Port-A-Cath tip at low right atrium. Right rotator cuff repair. Patient rotated right. Midline trachea. Normal heart size. No pleural effusion or pneumothorax. Minimal subsegmental atelectasis in the medial left lower lobe. IMPRESSION: No acute cardiopulmonary disease. Right Port-A-Cath tip at low right atrium. If cavoatrial junction position is desired, retraction of approximately 4 cm suggested. Electronically Signed   By: Abigail Miyamoto M.D.   On: 09/18/2019 10:44       Subjective: Feels better.  Less abdominal distention.  Just soreness from paracentesis. Discharge Exam: Vitals:   09/20/19 2320 09/21/19 0734  BP: (!) 132/57 125/64  Pulse: 99 98  Resp: 17 17  Temp: 98.1 F (36.7 C) (!) 97.3 F (36.3 C)  SpO2: 98% 98%   Vitals:   09/20/19 1547 09/20/19 1619 09/20/19 2320 09/21/19 0734  BP: 130/60 (!) 134/56 (!) 132/57 125/64  Pulse: 95 92 99 98  Resp: 18 17 17 17   Temp:  98.6 F (37 C) 98.1 F (36.7 C) (!) 97.3 F (36.3 C)  TempSrc:  Oral Oral Oral  SpO2: 96% 100% 98% 98%  Weight:      Height:         General: Pt is alert, awake, not in acute distress Cardiovascular: RRR, S1/S2 +, no rubs, no gallops Respiratory: CTA bilaterally, no wheezing, no rhonchi Abdominal: Soft, NT, ND, bowel sounds + Extremities: no edema, no cyanosis    The results of significant diagnostics from this hospitalization (including imaging, microbiology, ancillary and laboratory) are listed below for reference.     Microbiology: Recent Results (from the past 240 hour(s))  Culture, blood (routine x 2)     Status: None (Preliminary result)   Collection Time: 09/18/19  9:39 AM   Specimen: BLOOD  Result Value Ref Range Status   Specimen Description BLOOD BLOOD LEFT HAND  Final   Special Requests   Final    BOTTLES DRAWN AEROBIC AND ANAEROBIC Blood Culture adequate volume   Culture   Final    NO GROWTH 3 DAYS Performed at [facility], [address] [place], [place] [address]    Report Status PENDING  Incomplete  Culture, blood (routine x 2)     Status: None (Preliminary result)   Collection Time: 09/18/19  9:39 AM   Specimen: BLOOD  Result Value Ref Range Status   Specimen Description BLOOD RIGHT PORT  Final   Special Requests   Final  BOTTLES DRAWN AEROBIC AND ANAEROBIC Blood Culture adequate volume   Culture   Final    NO GROWTH 3 DAYS Performed at [facility], [address] [place], [place] [address]    Report Status PENDING  Incomplete  SARS Coronavirus 2 by RT PCR (hospital order, performed in [facility] hospital lab) Nasopharyngeal Nasopharyngeal Swab     Status: None   Collection Time: 09/18/19 11:22 AM   Specimen: Nasopharyngeal Swab  Result Value Ref Range Status   SARS Coronavirus 2 NEGATIVE NEGATIVE Final    Comment: (NOTE) SARS-CoV-2 target nucleic acids are NOT DETECTED.  The SARS-CoV-2 RNA is generally detectable in upper and lower respiratory specimens during the acute phase of infection. The lowest concentration of SARS-CoV-2 viral copies this assay  can detect is 250 copies / mL. A negative result does not preclude SARS-CoV-2 infection and should not be used as the sole basis for treatment or other patient management decisions.  A negative result may occur with improper specimen collection / handling, submission of specimen other than nasopharyngeal swab, presence of viral mutation(s) within the areas targeted by this assay, and inadequate number of viral copies (<250 copies / mL). A negative result must be combined with clinical observations, patient history, and epidemiological information.  Fact Sheet for Patients:   StrictlyIdeas.no  Fact Sheet for Healthcare Providers: BankingDealers.co.za  This test is not yet approved or  cleared by the [place] FDA and has been authorized for detection and/or diagnosis of SARS-CoV-2 by FDA under an Emergency Use Authorization (EUA).  This EUA will remain in effect (meaning this test can be used) for the duration of the COVID-19 declaration under Section 564(b)(1) of the Act, 21 U.S.C. section 360bbb-3(b)(1), unless the authorization is terminated or revoked sooner.  Performed at [facility], [address] [place], [place] [address]   Aerobic/Anaerobic Culture (surgical/deep wound)     Status: None (Preliminary result)   Collection Time: 09/20/19  3:43 PM   Specimen: PATH Cytology Peritoneal fluid  Result Value Ref Range Status   Specimen Description   Final    PERITONEAL Performed at [facility], [address] [place], [place] [address]    Special Requests   Final    NONE Performed at [facility], [address] [place], [place] [address]    Gram Stain   Final    RARE WBC PRESENT, PREDOMINANTLY MONONUCLEAR NO ORGANISMS SEEN    Culture   Final    NO GROWTH < 24 HOURS Performed at [facility], [place] [address] [place], [place] [address]    Report Status PENDING  Incomplete      Labs: BNP (last 3 results) Recent Labs    09/18/19 1417  BNP 235.5*   Basic Metabolic Panel: Recent Labs  Lab 09/18/19 0937 09/18/19 0937 09/18/19 1417 09/19/19 0018 09/19/19 0423 09/20/19 0500 09/21/19 0556  NA 124*   < > 125* 127* 127* 129* 128*  K 5.4*   < > 5.4* 5.2* 5.0 4.3 4.2  CL 90*   < > 88* 90* 89* 94* 96*  CO2 27   < > 27 28 30 27 27   GLUCOSE 167*   < > 156* 146* 148* 116* 124*  BUN 54*   < > 52* 62* 64* 39* 26*  CREATININE 0.59   < > 0.60 0.59 0.67 0.62 0.45  CALCIUM 8.5*   < > 8.3* 8.5* 8.6* 8.3* 7.7*  MG 1.9  --   --   --   --  1.8  --    < > = values in this interval not displayed.   Liver Function Tests: Recent Labs  Lab 09/18/19 0813  AST 29  ALT 38  ALKPHOS 211*  BILITOT 0.4  PROT 5.0*  ALBUMIN 2.0*   No results for input(s): LIPASE, AMYLASE in the last 168 hours. Recent Labs  Lab 09/18/19 1417  AMMONIA 24   CBC: Recent Labs  Lab 09/18/19 0813 09/18/19 1417 09/19/19 1354 09/19/19 1922 09/20/19 0203 09/20/19 0556 09/21/19 0556  WBC 26.7*   < > 20.6* 20.8* 13.4* 12.9* 10.1  NEUTROABS 24.2*  --   --   --   --   --   --   HGB 8.7*   < > 9.8* 9.0* 7.8* 8.6* 8.1*  HCT 25.3*   < > 28.7* 26.3* 22.8* 24.7* 24.2*  MCV 85.2   < > 83.7 84.3 84.8 83.2 85.2  PLT 641*   < > 452* 416* 382 406* 381   < > = values in this interval not displayed.   Cardiac Enzymes: No results for input(s): CKTOTAL, CKMB, CKMBINDEX, TROPONINI in the last 168 hours. BNP: Invalid input(s): POCBNP CBG: Recent Labs  Lab 09/19/19 0738 09/20/19 0725  GLUCAP 129* 121*   D-Dimer No results for input(s): DDIMER in the last 72 hours. Hgb A1c No results for input(s): HGBA1C in the last 72 hours. Lipid Profile No results for input(s): CHOL, HDL, LDLCALC, TRIG, CHOLHDL, LDLDIRECT in the last 72 hours. Thyroid function studies No results for input(s): TSH, T4TOTAL, T3FREE, THYROIDAB in the last 72 hours.  Invalid input(s): FREET3 Anemia work up No results for  input(s): VITAMINB12, FOLATE, FERRITIN, TIBC, IRON, RETICCTPCT in the last 72 hours. Urinalysis    Component Value Date/Time   COLORURINE YELLOW (A) 09/18/2019 1351   APPEARANCEUR HAZY (A) 09/18/2019 1351   LABSPEC >1.046 (H) 09/18/2019 1351   PHURINE 5.0 09/18/2019 1351   GLUCOSEU NEGATIVE 09/18/2019 1351   HGBUR SMALL (A) 09/18/2019 1351   BILIRUBINUR NEGATIVE 09/18/2019 1351   KETONESUR NEGATIVE 09/18/2019 1351   PROTEINUR NEGATIVE 09/18/2019 1351   NITRITE NEGATIVE 09/18/2019 1351   LEUKOCYTESUR NEGATIVE 09/18/2019 1351   Sepsis Labs Invalid input(s): PROCALCITONIN,  WBC,  LACTICIDVEN Microbiology Recent Results (from the past 240 hour(s))  Culture, blood (routine x 2)     Status: None (Preliminary result)   Collection Time: 09/18/19  9:39 AM   Specimen: BLOOD  Result Value Ref Range Status   Specimen Description BLOOD BLOOD LEFT HAND  Final   Special Requests   Final    BOTTLES DRAWN AEROBIC AND ANAEROBIC Blood Culture adequate volume   Culture   Final    NO GROWTH 3 DAYS Performed at [facility], [address] [place], [place] [address]    Report Status PENDING  Incomplete  Culture, blood (routine x 2)     Status: None (Preliminary result)   Collection Time: 09/18/19  9:39 AM   Specimen: BLOOD  Result Value Ref Range Status   Specimen Description BLOOD RIGHT PORT  Final   Special Requests   Final    BOTTLES DRAWN AEROBIC AND ANAEROBIC Blood Culture adequate volume   Culture   Final    NO GROWTH 3 DAYS Performed at [facility], [address] [place], [place] [address]    Report Status PENDING  Incomplete  SARS Coronavirus 2 by RT PCR (hospital order, performed in [facility] hospital lab) Nasopharyngeal Nasopharyngeal Swab     Status:  None   Collection Time: 09/18/19 11:22 AM   Specimen: Nasopharyngeal Swab  Result Value Ref Range Status   SARS Coronavirus 2 NEGATIVE NEGATIVE Final    Comment: (NOTE) SARS-CoV-2 target nucleic acids are  NOT DETECTED.  The SARS-CoV-2 RNA is generally detectable in upper and lower respiratory specimens during the acute phase of infection. The lowest concentration of SARS-CoV-2 viral copies this assay can detect is 250 copies / mL. A negative result does not preclude SARS-CoV-2 infection and should not be used as the sole basis for treatment or other patient management decisions.  A negative result may occur with improper specimen collection / handling, submission of specimen other than nasopharyngeal swab, presence of viral mutation(s) within the areas targeted by this assay, and inadequate number of viral copies (<250 copies / mL). A negative result must be combined with clinical observations, patient history, and epidemiological information.  Fact Sheet for Patients:   StrictlyIdeas.no  Fact Sheet for Healthcare Providers: BankingDealers.co.za  This test is not yet approved or  cleared by the [place] FDA and has been authorized for detection and/or diagnosis of SARS-CoV-2 by FDA under an Emergency Use Authorization (EUA).  This EUA will remain in effect (meaning this test can be used) for the duration of the COVID-19 declaration under Section 564(b)(1) of the Act, 21 U.S.C. section 360bbb-3(b)(1), unless the authorization is terminated or revoked sooner.  Performed at [facility], [address] [place], [place] [address]   Aerobic/Anaerobic Culture (surgical/deep wound)     Status: None (Preliminary result)   Collection Time: 09/20/19  3:43 PM   Specimen: PATH Cytology Peritoneal fluid  Result Value Ref Range Status   Specimen Description   Final    PERITONEAL Performed at [facility] [address] [place], [place] [address]    Special Requests   Final    NONE Performed at [facility], [address] [place], [place] [address]    Gram Stain   Final    RARE WBC PRESENT, PREDOMINANTLY  MONONUCLEAR NO ORGANISMS SEEN    Culture   Final    NO GROWTH < 24 HOURS Performed at [facility], [place] [address] [place], [place] [address]    Report Status PENDING  Incomplete     Time coordinating discharge: Over 30 minutes  SIGNED:   Nolberto Hanlon, MD  [facility] 09/21/2019, 1:37 PM Pager   If 7PM-7AM, please contact night-coverage [email or web address] Password [email or web address]

## 2019-09-21 NOTE — TOC Progression Note (Signed)
Transition of Care [facility]) - Progression Note    Patient Details  Name: Donna Yang MRN: [id number] Date of Birth: 1940/03/09  Transition of Care [facility]) CM/SW [place], RN Phone Number: 09/21/2019, 3:52 PM  Clinical Narrative:   RNCM met with patient at bedside, sister present as well. Both patient and sister agree that patient would prefer to go home with Hospice services and would prefer those services be through [facility]. Santiago Glad with [facility] at bedside to meet with patient.          Expected Discharge Plan and Services           Expected Discharge Date: 09/21/19                                     Social Determinants of Health (SDOH) Interventions    Readmission Risk Interventions No flowsheet data found.

## 2019-09-23 LAB — CULTURE, BLOOD (ROUTINE X 2)
Culture: NO GROWTH
Culture: NO GROWTH
Special Requests: ADEQUATE
Special Requests: ADEQUATE

## 2019-09-25 LAB — AEROBIC/ANAEROBIC CULTURE W GRAM STAIN (SURGICAL/DEEP WOUND): Culture: NO GROWTH

## 2019-09-26 LAB — CYTOLOGY - NON PAP

## 2019-09-27 ENCOUNTER — Telehealth: Payer: Self-pay | Admitting: Hospice and Palliative Medicine

## 2019-09-27 NOTE — Telephone Encounter (Signed)
I spoke to patient's hospice nurse.  Reportedly, patient has had severe burning and esophageal pain despite being on omeprazole 40 mg daily.  Okay to increase omeprazole to 40 mg twice daily x2 weeks.  Monitor for abdominal distention as she is at risk of recurrent ascites, which might exacerbate GERD with increased abd pressure. She may require repeat paracentesis.  I requested nurse perform an oral exam to rule out thrush.

## 2019-10-02 ENCOUNTER — Encounter: Payer: Self-pay | Admitting: Gastroenterology

## 2019-10-05 ENCOUNTER — Other Ambulatory Visit: Payer: Self-pay | Admitting: Hospice and Palliative Medicine

## 2019-10-05 MED ORDER — HYDROCODONE-ACETAMINOPHEN 5-325 MG PO TABS
1.0000 | ORAL_TABLET | ORAL | 0 refills | Status: AC | PRN
Start: 2019-10-05 — End: ?

## 2019-10-05 NOTE — Progress Notes (Signed)
I spoke with patient's hospice nurse. Will refill Norco 5-325mg  Q4H PRN, #90. PDMP reviewed.

## 2019-10-09 ENCOUNTER — Other Ambulatory Visit: Payer: Self-pay | Admitting: Hospice and Palliative Medicine

## 2019-10-09 DIAGNOSIS — R180 Malignant ascites: Secondary | ICD-10-CM

## 2019-10-09 NOTE — Progress Notes (Signed)
I spoke with patient's hospice nurse. Patient has had progressive abd distension since last paracentesis on 9/15 after 3L removed. Will order repeat paracentesis.

## 2019-10-10 ENCOUNTER — Telehealth: Payer: Self-pay | Admitting: *Deleted

## 2019-10-10 NOTE — Telephone Encounter (Signed)
I called and spoke with patient's sister.  She says the patient has been declining and is now mostly chair bound.  She is symptomatic from the abdominal distention with pain and early satiety.  She recognizes that ascites will likely reaccumulate following the paracentesis.  She also recognizes the patient is likely nearing end-of-life.  All questions answered and they want to consider options on whether to pursue the paracentesis or just supportive care at home.

## 2019-10-10 NOTE — Telephone Encounter (Signed)
Patient scheduled for US guided paracentesis on Thursday 10/7. Patients sister Gregary Signs made aware of appointment details, patient to arrive at 12:30 for 1:00 procedure. Patients sister states they will call back to reschedule if there are conflicts with this appointment.

## 2019-10-10 NOTE — Telephone Encounter (Signed)
Patient sister Pam Horner called asking to speak with Sharion Dove, NP to discuss pros and cons of another Paracentesis since she was so sore after the last one. Please return her call [phone]

## 2019-10-12 ENCOUNTER — Ambulatory Visit: Admission: RE | Admit: 2019-10-12 | Payer: Medicare HMO | Source: Ambulatory Visit

## 2019-10-17 ENCOUNTER — Ambulatory Visit
Admission: RE | Admit: 2019-10-17 | Discharge: 2019-10-17 | Disposition: A | Discharge status: Home / Self Care | Source: Ambulatory Visit | Attending: Hospice and Palliative Medicine | Admitting: Hospice and Palliative Medicine

## 2019-10-17 ENCOUNTER — Other Ambulatory Visit: Payer: Self-pay

## 2019-10-17 DIAGNOSIS — R180 Malignant ascites: Secondary | ICD-10-CM | POA: Diagnosis present

## 2019-10-17 DIAGNOSIS — C189 Malignant neoplasm of colon, unspecified: Secondary | ICD-10-CM | POA: Diagnosis not present

## 2019-10-17 NOTE — Procedures (Signed)
Pre Procedural Dx: Symptomatic Ascites Post Procedural Dx: Same  Successful ultrasound-guided paracentesis yielding 250 cc of peritoneal fluid.   Small amount of presumably loculated fluid within the right mid hemiabdomen is unlikely to yield more than an additional 200 cc of fluid for which patient did not wish to undergo image guided aspiration of this separate collection.   Given the presumed loculated nature of the patient's small volume intra-abdominal ascites, the patient is not likely to be a good candidate for future ultrasound-guided paracentesis.  If clinical concern persists, further evaluation with contrast-enhanced abdominal CT could be performed as indicated.   EBL: None Complications: None immediate  Ronny Bacon, MD Pager #: [phone]

## 2019-10-27 ENCOUNTER — Telehealth: Payer: Self-pay | Admitting: Hospice and Palliative Medicine

## 2019-10-27 NOTE — Telephone Encounter (Signed)
I received a call from hospice nurse. Patient has had diarrhea and is not getting much relief from PRN imodium. Okay to try Lomotil.

## 2019-11-06 DEATH — deceased

## 2021-04-28 IMAGING — CT CT HEAD W/O CM
3 series · 16 of 47 positions shown, 19 images · non-contrast
Comparison: None.

CLINICAL DATA: Mental status change.

EXAM:
CT HEAD WITHOUT CONTRAST
TECHNIQUE: Contiguous axial images were obtained from the base of the skull
through the vertex without intravenous contrast.

[Series 3: coronal soft tissue · coronal · 0.30mm/px · 3 of 68 slices shown]
[im 23/68  brain]
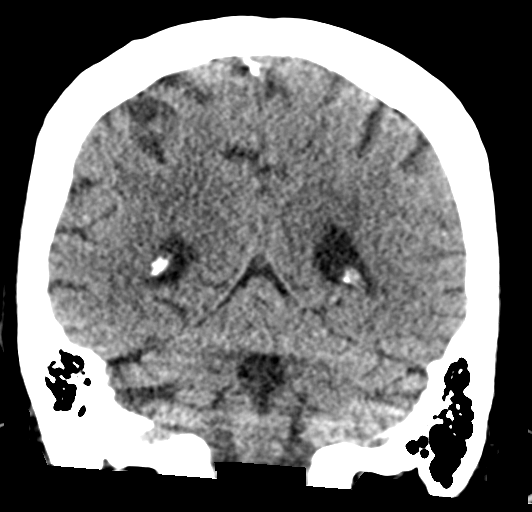
[im 30/68  brain]
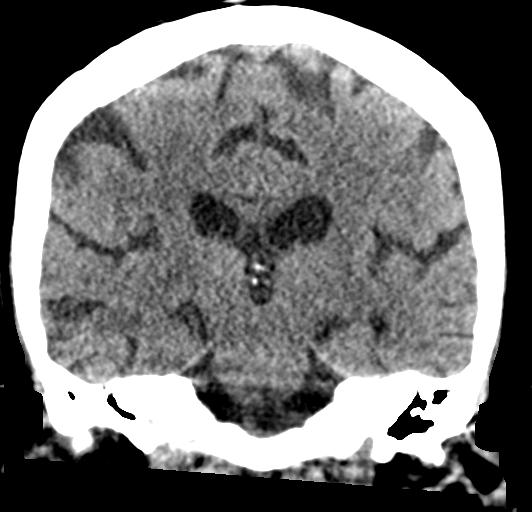
[im 38/68  brain]
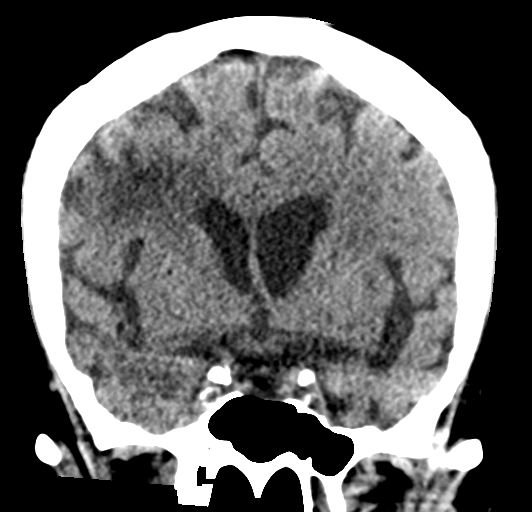

[Series 4: sagittal soft tissue · sagittal · 0.32mm/px · 3 of 55 slices shown]
[im 19/55  brain]
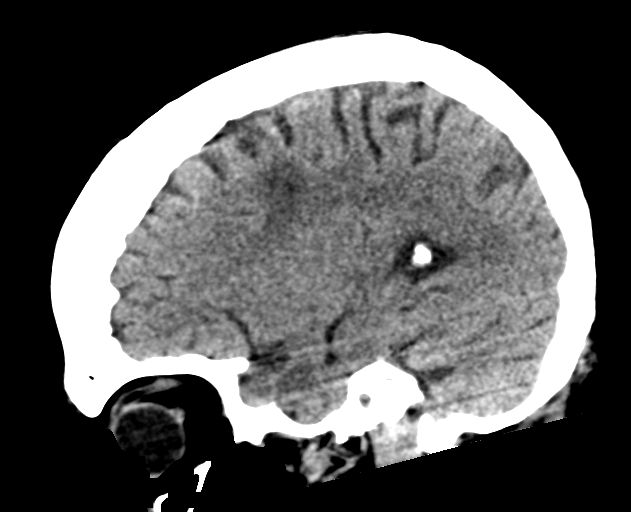
[im 28/55  brain]
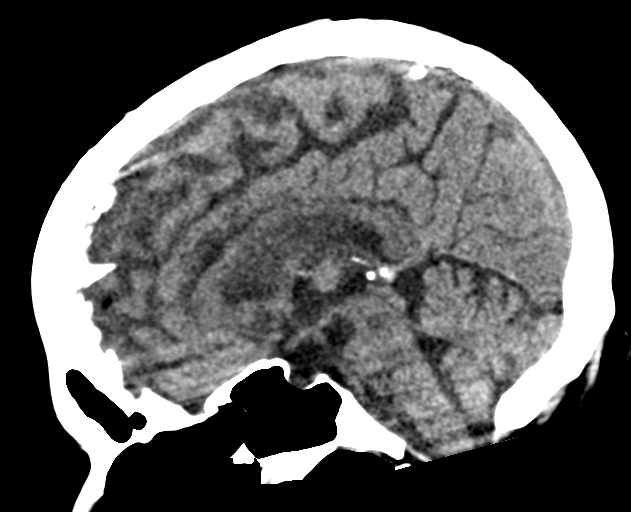
[im 37/55  brain]
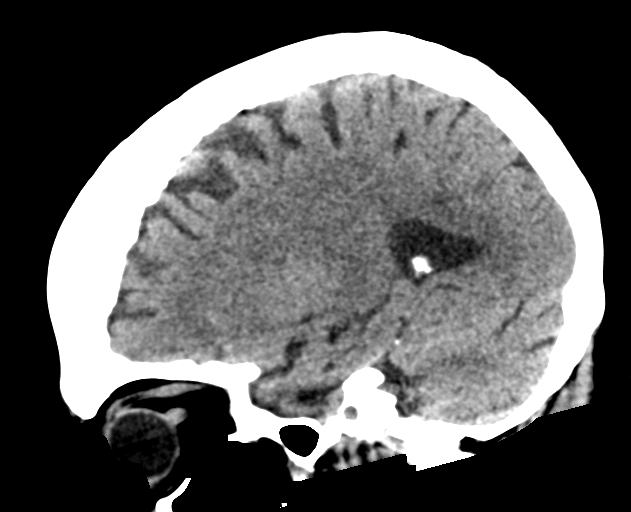

[Series 5: head wo · axial · 0.39mm/px · z∈[+364,+489]mm · 10 of 31 slices shown, 13 images]
[im 3/31  brain]
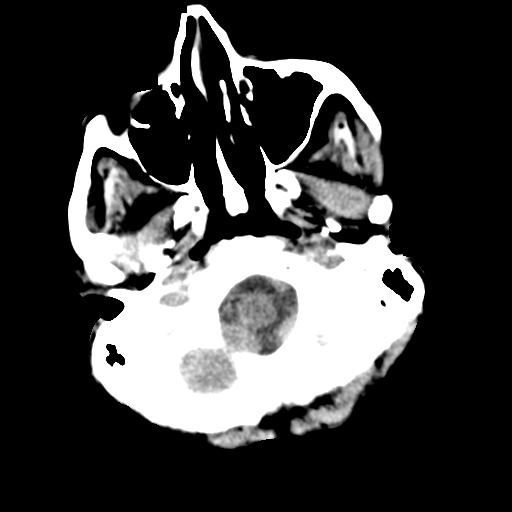
[im 3/31  bone]
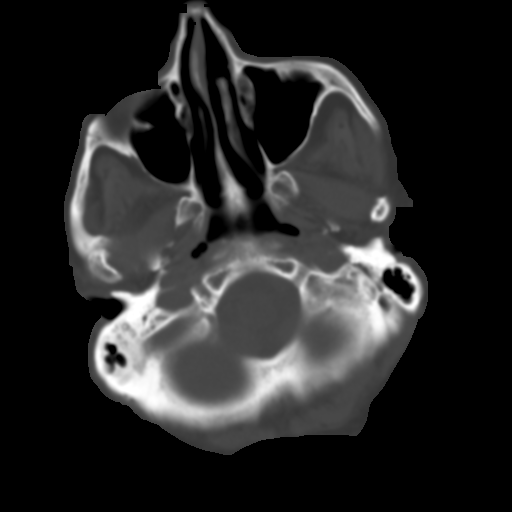
[im 6/31  brain]
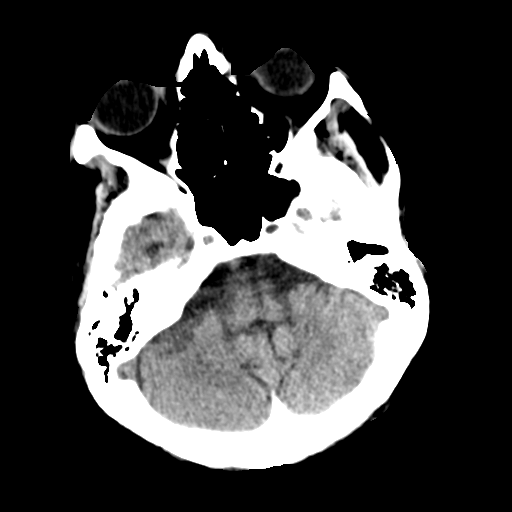
[im 9/31  brain]
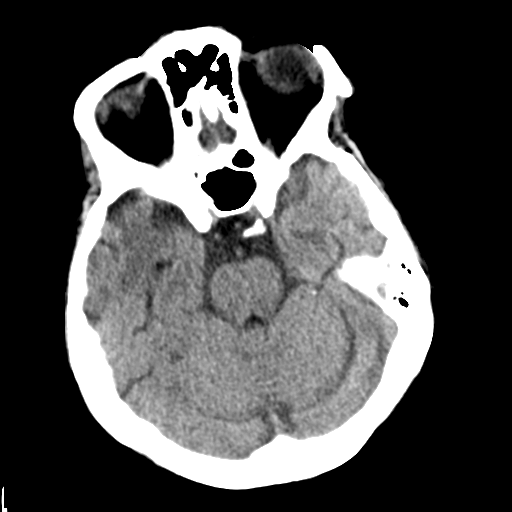
[im 11/31  brain]
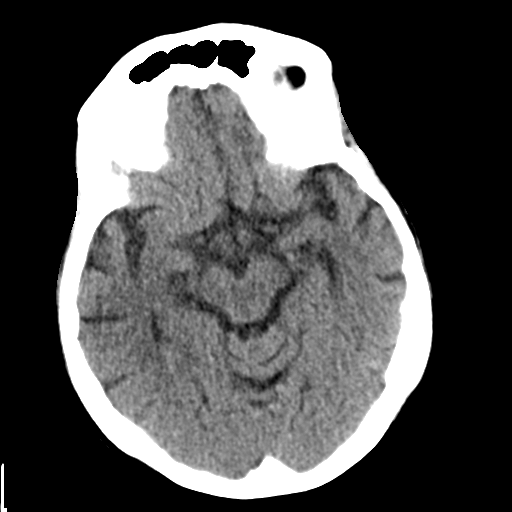
[im 14/31  brain]
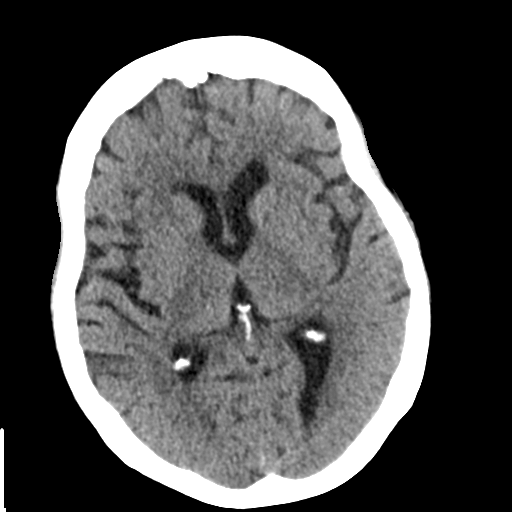
[im 14/31  bone]
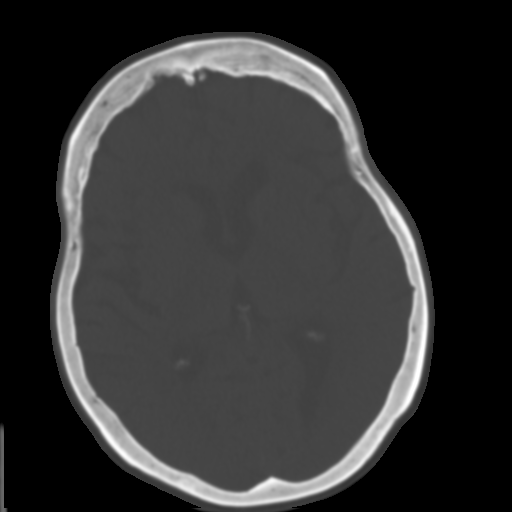
[im 17/31  brain]
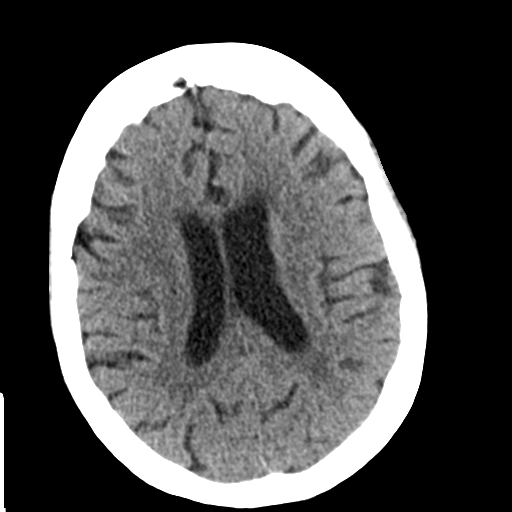
[im 20/31  brain]
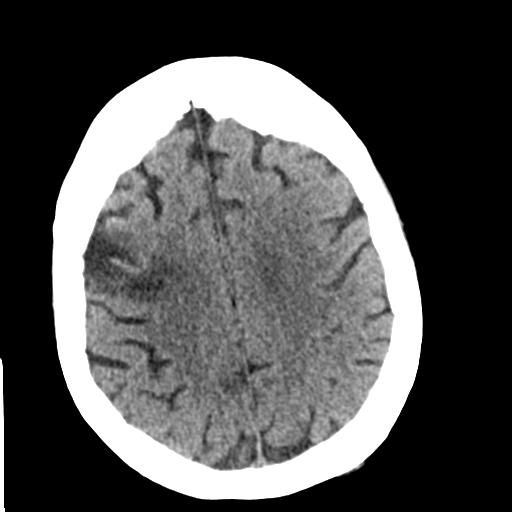
[im 23/31  brain]
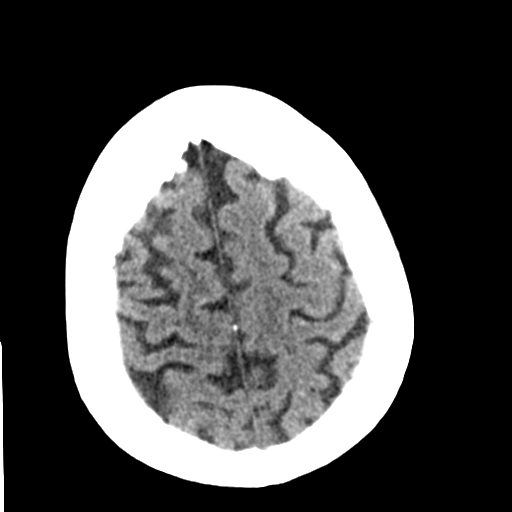
[im 25/31  brain]
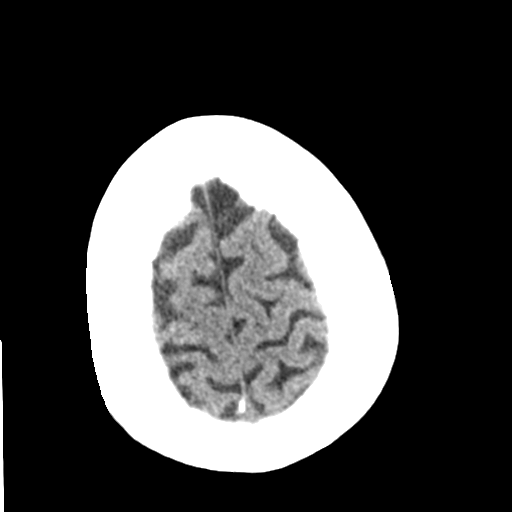
[im 25/31  bone]
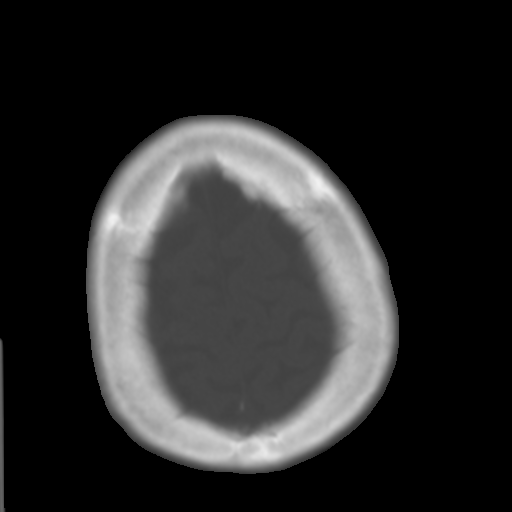
[im 28/31  brain]
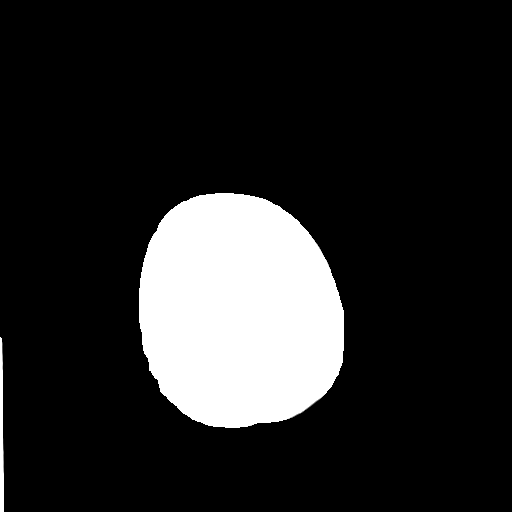

[16 of 47 positions shown; findings below may reference images not displayed]

FINDINGS: Brain: Age related cerebral atrophy, ventriculomegaly and
periventricular white matter disease. No extra-axial fluid
collections are identified. No CT findings for acute hemispheric
infarction or intracranial hemorrhage. No mass lesions. There is a
focal area of encephalomalacia involving the cortical and
subcortical region of the right parietal lobe most consistent with
an old infarct. The brainstem and cerebellum are normal.

Vascular: Scattered vascular calcifications. No aneurysm or
hyperdense vessels.

Skull: Hyperostosis frontalis interna. No fracture or bone lesion.

Sinuses/Orbits: The paranasal sinuses and mastoid air cells are
clear. The globes are intact.

Other: No scalp lesions or scalp hematoma.
IMPRESSION: 1. Age related cerebral atrophy, ventriculomegaly and
periventricular white matter disease.
2. Remote right parietal lobe infarct.
3. No acute intracranial findings or mass lesions.

## 2021-04-28 IMAGING — CT CT ABD-PELV W/ CM
2 of 5 series · 15 of 46 positions shown, 17 images · IV contrast (omnipaque)
Comparison: None.

CLINICAL DATA: Abdominal pain and nausea. History of colon cancer
on chemotherapy.

EXAM:
CT ABDOMEN AND PELVIS WITH CONTRAST
TECHNIQUE: Multidetector CT imaging of the abdomen and pelvis was performed
using the standard protocol following bolus administration of
intravenous contrast.
CONTRAST:  100mL OMNIPAQUE IOHEXOL 300 MG/ML  SOLN

[Series 2: routine abd/pel with · axial · 0.80mm/px · z∈[-428,+6]mm · 12 of 99 slices shown, 14 images]
[im 6/99  soft-tissue]
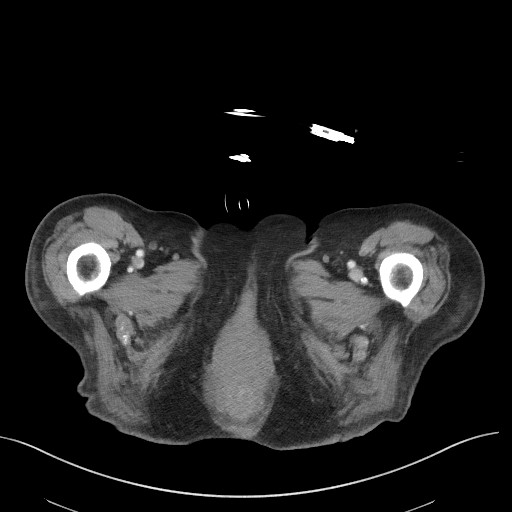
[im 6/99  bone]
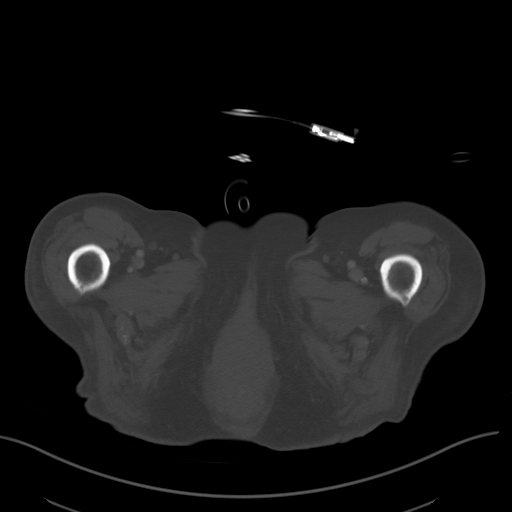
[im 16/99  soft-tissue]
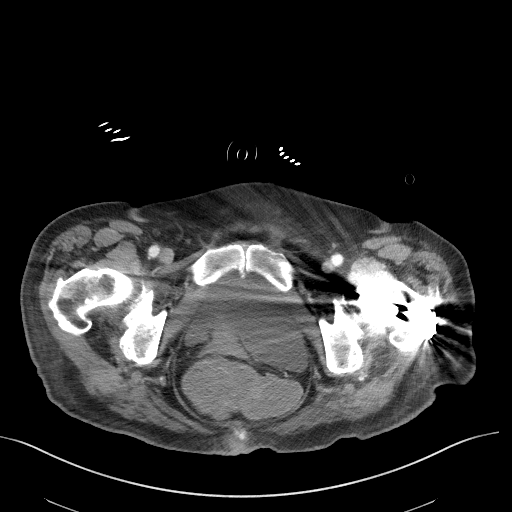
[im 21/99  soft-tissue]
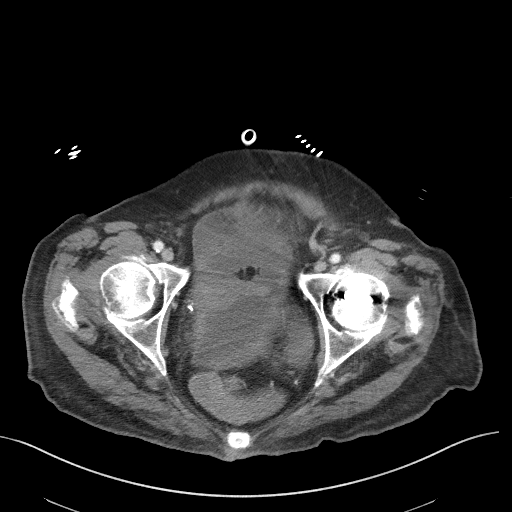
[im 31/99  soft-tissue]
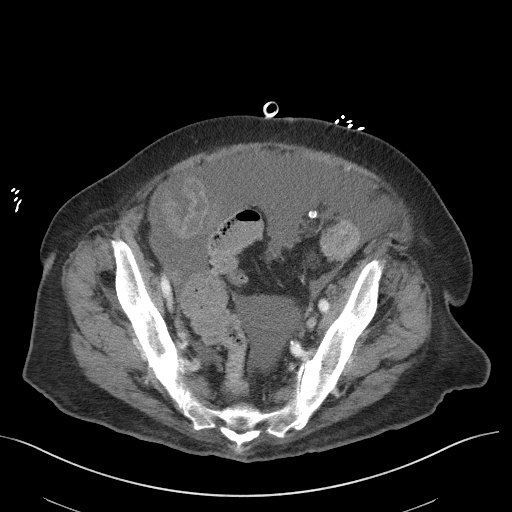
[im 37/99  soft-tissue]
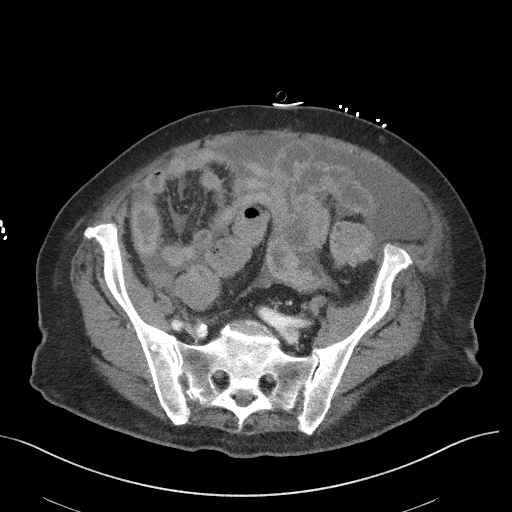
[im 47/99  soft-tissue]
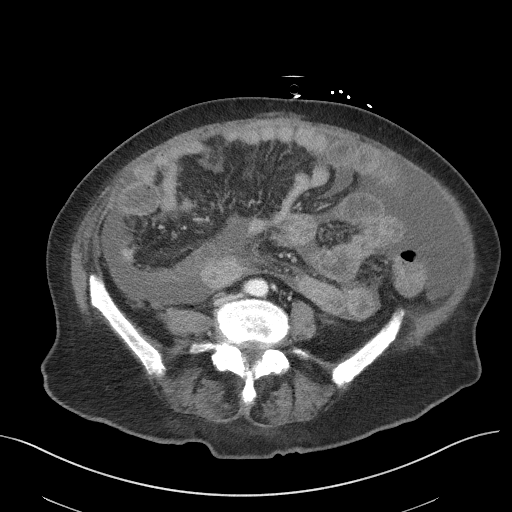
[im 52/99  soft-tissue]
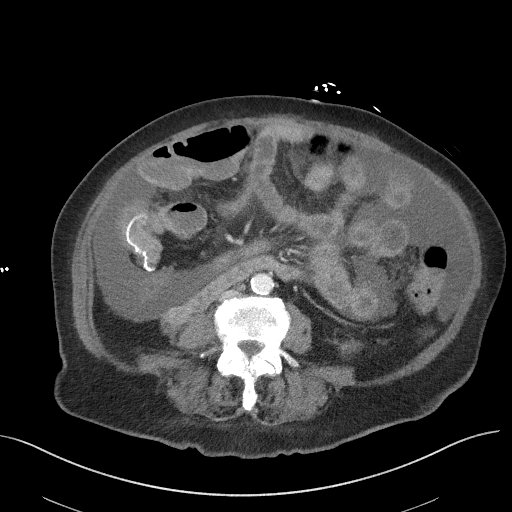
[im 62/99  soft-tissue]
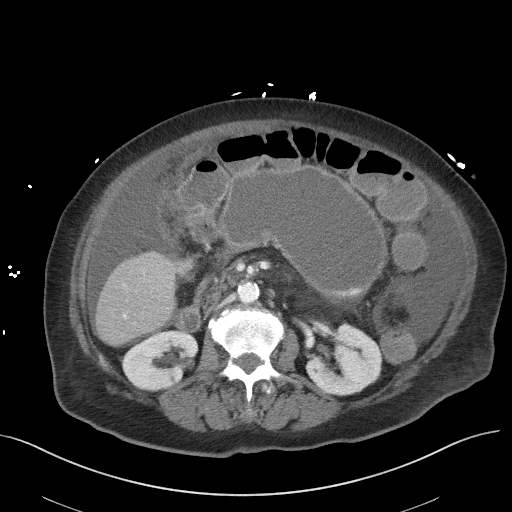
[im 68/99  soft-tissue]
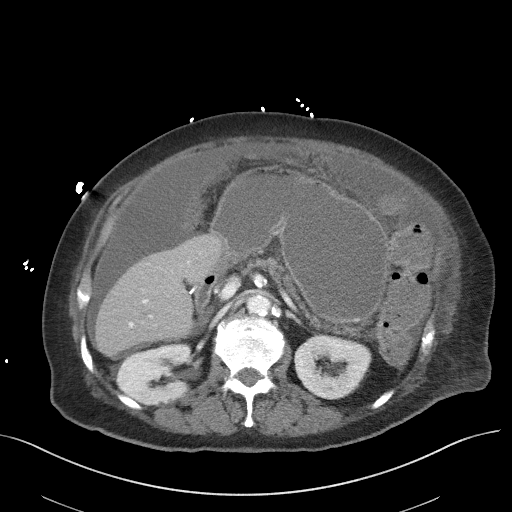
[im 68/99  bone]
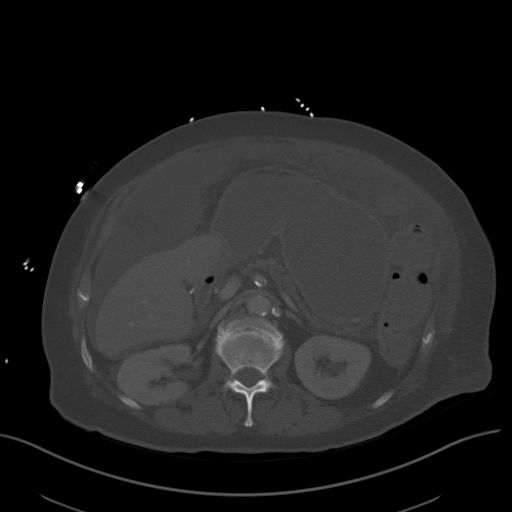
[im 78/99  soft-tissue]
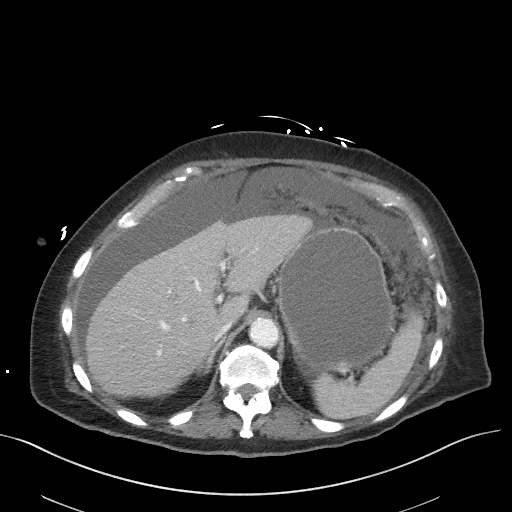
[im 83/99  soft-tissue]
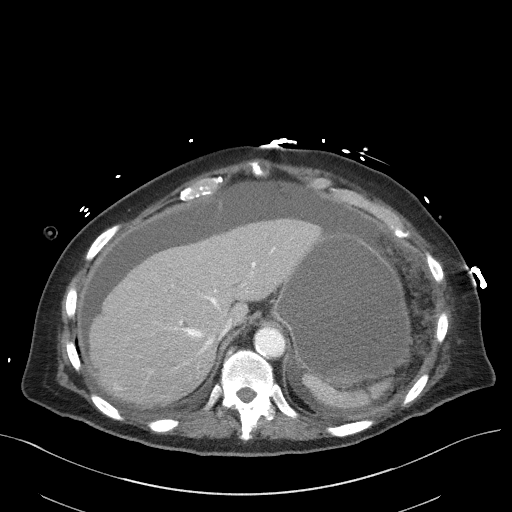
[im 93/99  soft-tissue]
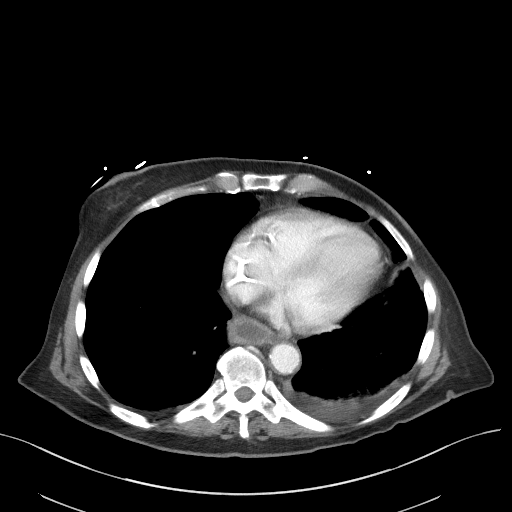

[Series 5: coronal st · coronal · 0.83mm/px · 3 of 103 slices shown]
[im 35/103  soft-tissue]
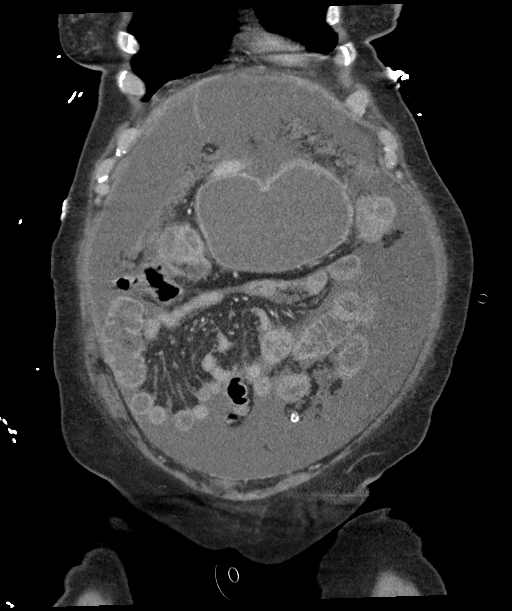
[im 46/103  soft-tissue]
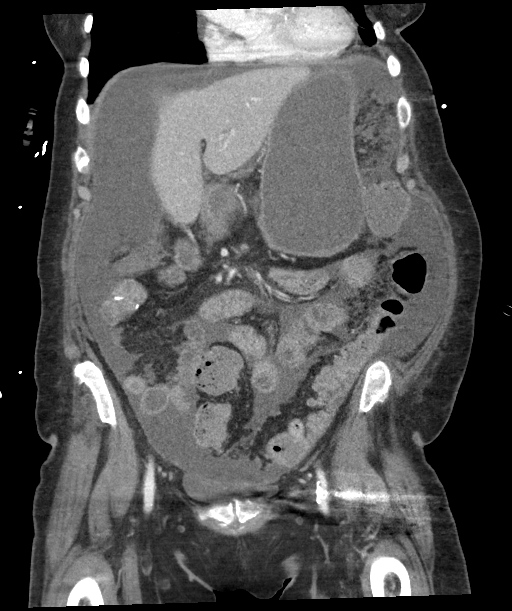
[im 57/103  soft-tissue]
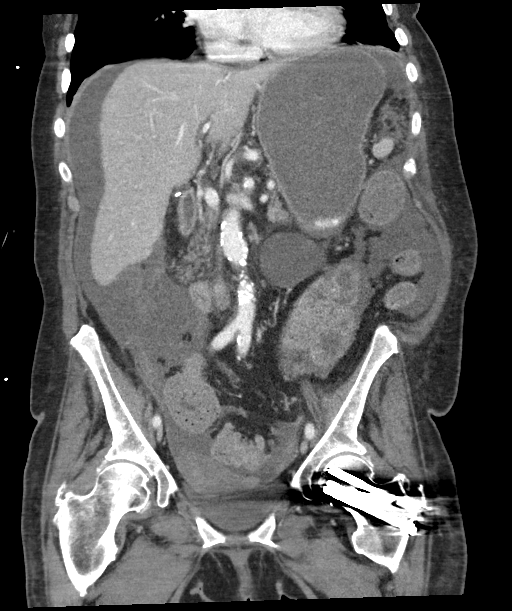

[15 of 46 positions shown; findings below may reference images not displayed]

FINDINGS: Lower chest: Small left pleural effusion is noted with overlying
atelectasis. No worrisome pulmonary nodules. The heart is within
normal limits in size for age. Aortic and three-vessel coronary
artery calcifications are noted.

Slightly dilated fluid-filled distal esophagus is noted. No obvious
hiatal hernia.

Hepatobiliary: No intrahepatic hepatic metastatic disease is
identified. However findings are consistent with peritoneal surface
disease involving the liver. No intrahepatic biliary dilatation. The
gallbladder is surgically absent. No common bile duct dilatation.

Pancreas: Fairly significant pancreatic atrophy but no mass,
inflammation or ductal dilatation.

Spleen: Normal size. No focal lesions.

Adrenals/Urinary Tract: There is a solid enhancing left adrenal
gland lesion which is indeterminate. Correlation with prior imaging
studies is recommended. The right adrenal gland is normal.

There are bilateral renal cysts but no worrisome renal lesions or
hydronephrosis. The bladder is grossly normal.

Stomach/Bowel: The stomach is moderately distended with fluid. I do
not see an obvious gastric outlet obstruction. Findings could be due
to gastroparesis.

The small bowel loops demonstrate scattered fluid and mild bowel
wall thickening and enhancement without obvious discrete mass. No
obstruction. Anastomosis is noted in the right abdomen like this
sided patient's tumor resection.

Scattered moderate sigmoid colon diverticulosis without findings for
acute diverticulitis. Suspect rectal prolapse.

Vascular/Lymphatic: Moderate atherosclerotic calcifications
involving the aorta and branch vessels but no aneurysm or
dissection. The major venous structures are patent. Small scattered
mesenteric and retroperitoneal lymph nodes but no mass or overt
adenopathy.

However, there is extensive omental disease. Index mass in the right
abdomen adjacent to the inferior aspect of the liver measures 6.2 x
4.0 cm on image number 43 and is directly invading the medial distal
tip of the liver.

Reproductive: The uterus and ovaries are unremarkable.

Other: Moderate to large volume malignant ascites throughout the
abdomen and pelvis.

Musculoskeletal: No significant bony findings.
IMPRESSION: 1. Extensive peritoneal carcinomatosis with omental caking and
peritoneal surface disease.
2. Moderate to large volume malignant ascites throughout the abdomen
and pelvis.
3. Indeterminate left adrenal gland lesion. Correlation with prior
imaging studies is recommended.
4. Distended fluid-filled stomach and small bowel loops could be due
to gastroparesis/ileus.
5. Suspect rectal prolapse.
6. Small left pleural effusion with overlying atelectasis.
7. Aortic atherosclerosis.

Aortic Atherosclerosis ([id number]).

## 2021-04-30 IMAGING — US US PARACENTESIS
1 series · 9 of 9 positions shown · non-contrast
Comparison: None.

MEDICATIONS:
None.

COMPLICATIONS:
None immediate.

INDICATION: History of colon cancer, now with symptomatic intra-abdominal
ascites. Please perform ultrasound-guided paracentesis for
diagnostic and therapeutic purposes.

EXAM:
ULTRASOUND-GUIDED PARACENTESIS
TECHNIQUE: Informed written consent was obtained from the patient after a
discussion of the risks, benefits and alternatives to treatment. A
timeout was performed prior to the initiation of the procedure.

[Series 1: us paracentesis · 9 of 9 slices shown]
[im 1/9]
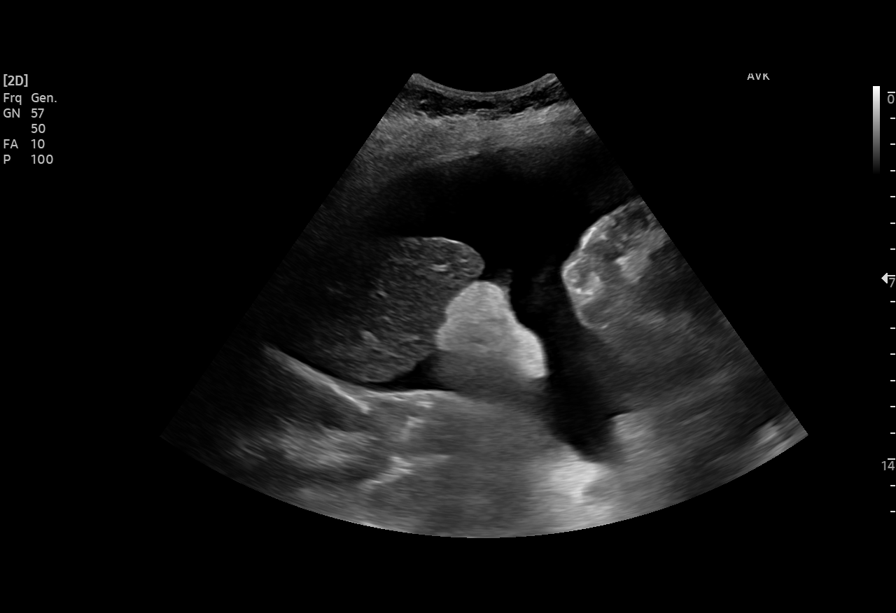
[im 2/9]
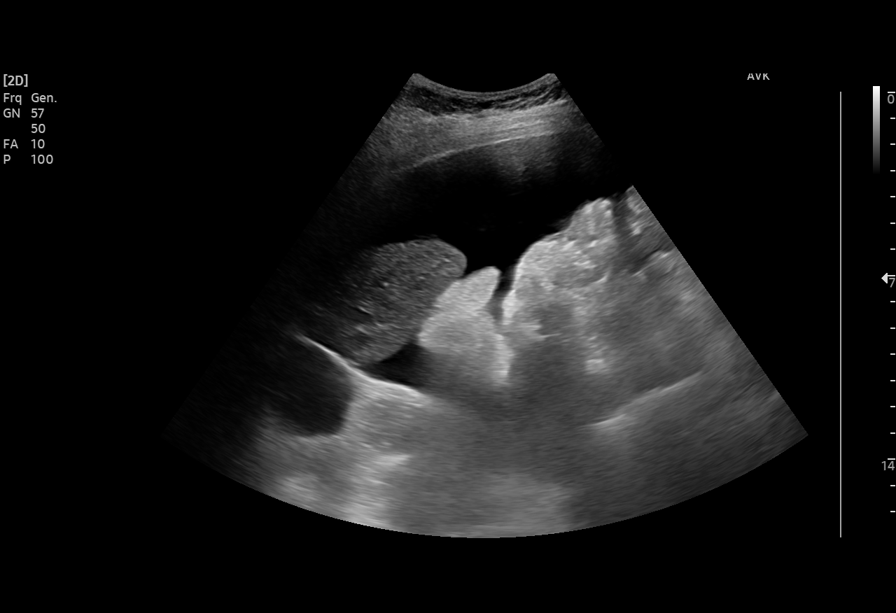
[im 3/9]
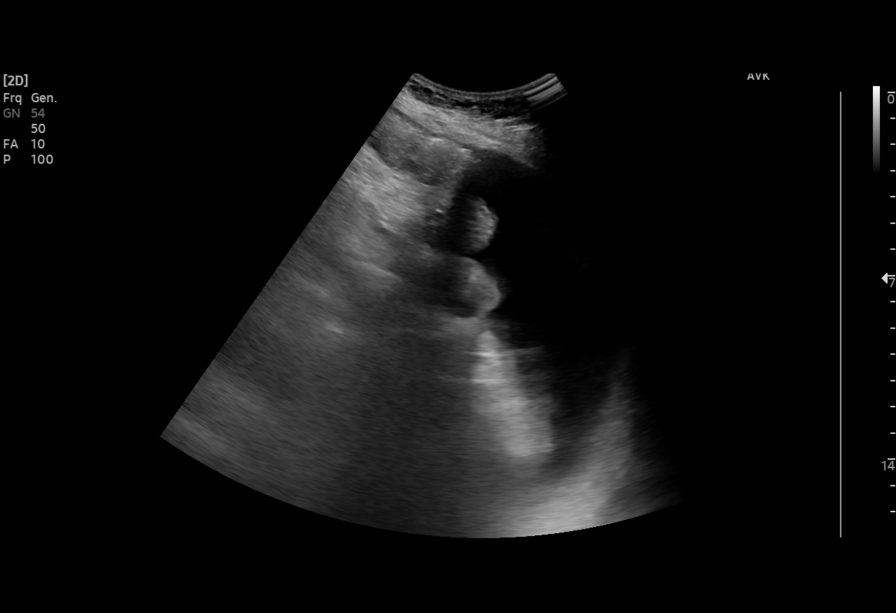
[im 4/9]
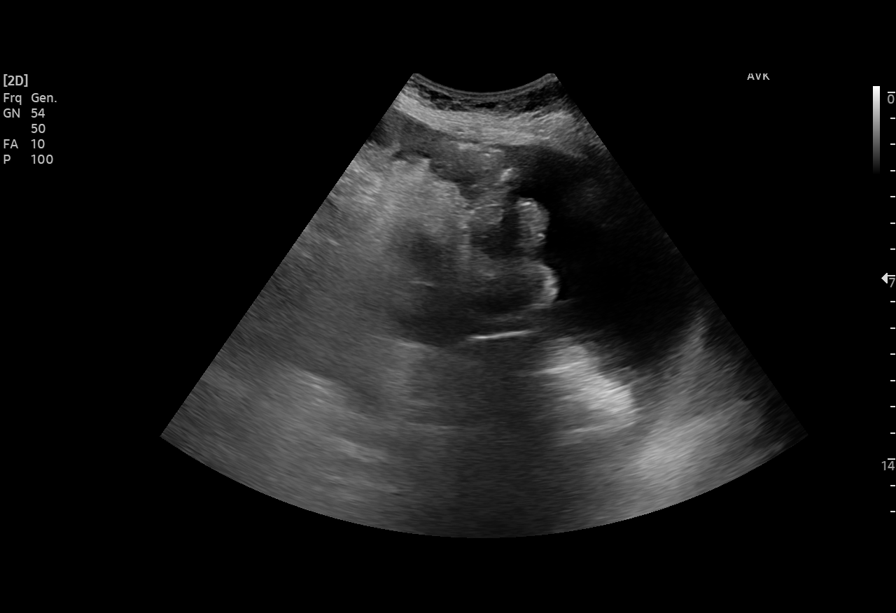
[im 5/9]
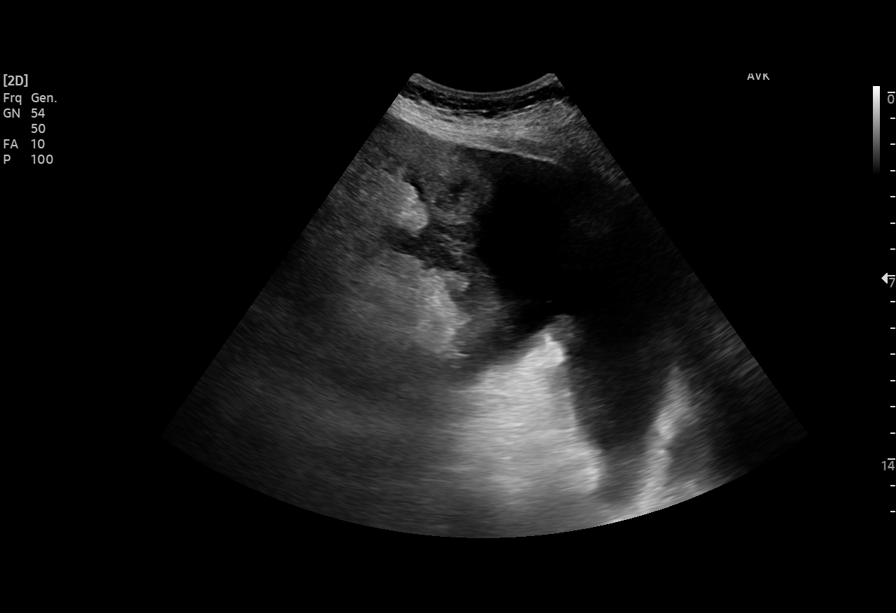
[im 6/9]
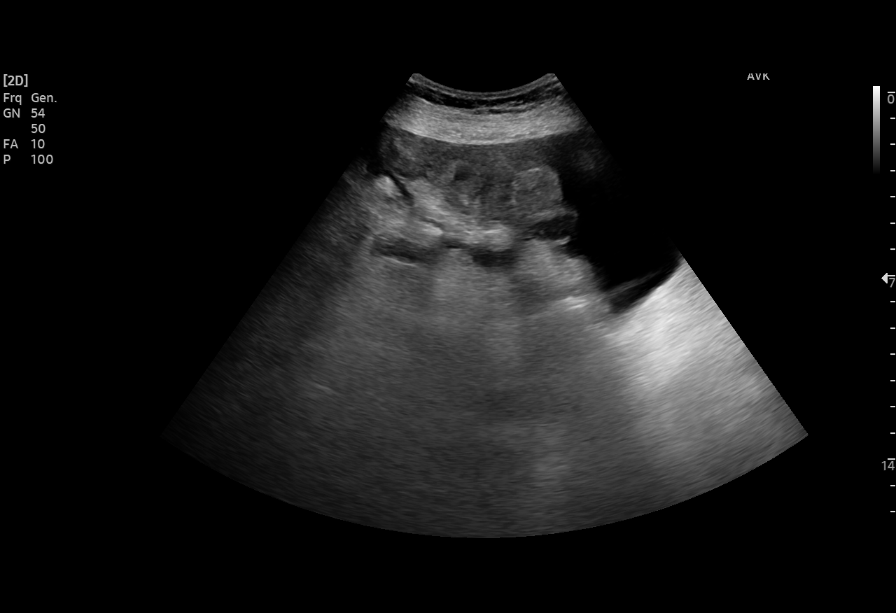
[im 7/9]
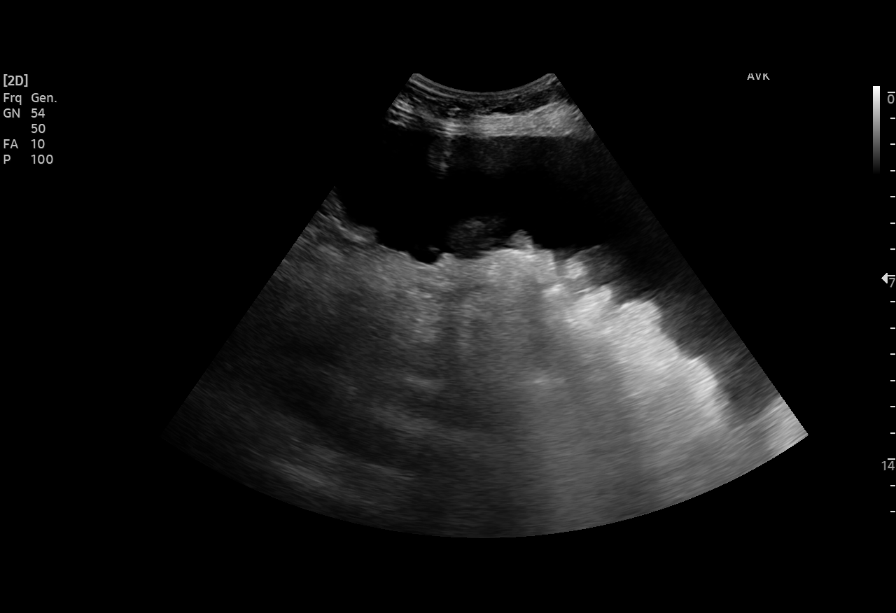
[im 8/9]
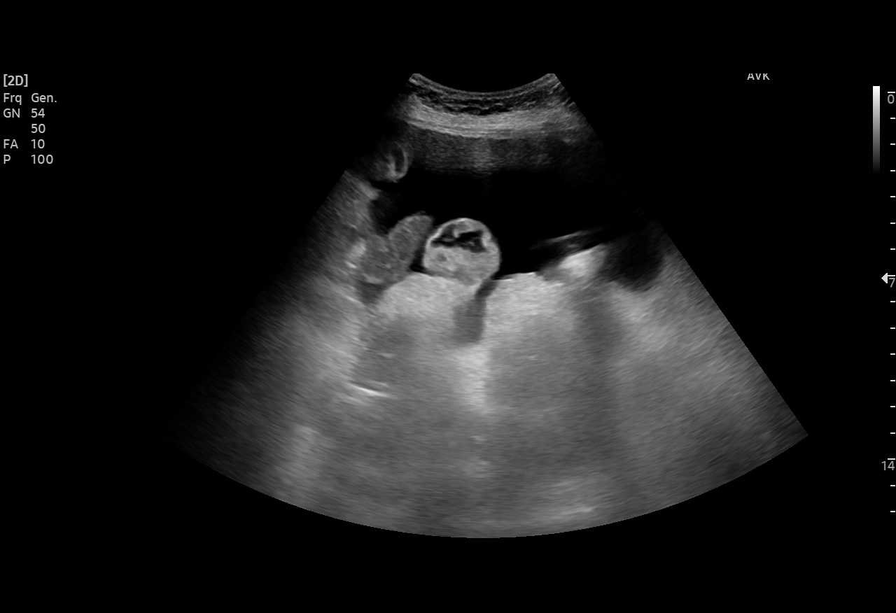
[im 9/9]
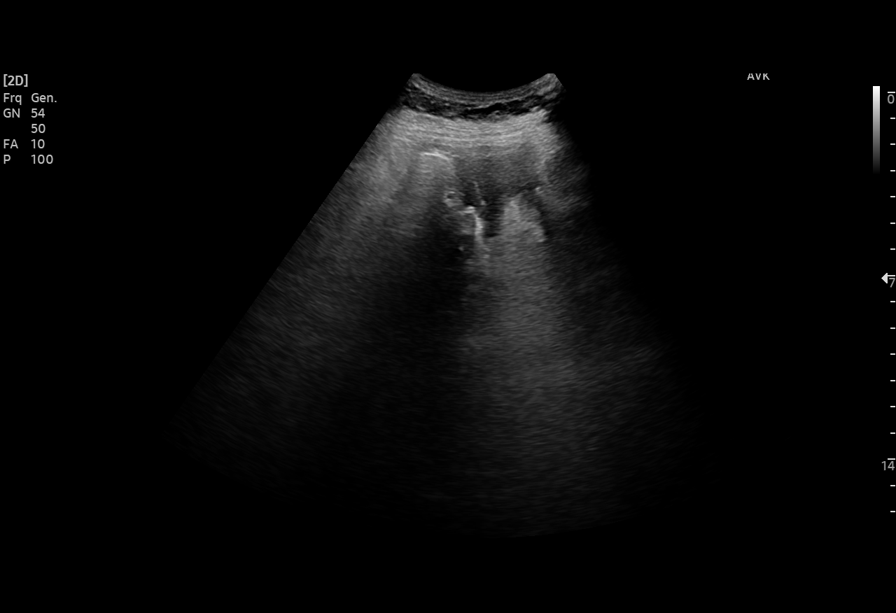

[9 of 9 positions shown; findings below may reference images not displayed]

Initial ultrasound scanning demonstrates a moderate amount of
ascites within the right lower abdomen which was subsequently
prepped and draped in the usual sterile fashion. 1% lidocaine with
epinephrine was used for local anesthesia. Under direct ultrasound
guidance, an 8 Fr Safe-T-Centesis catheter was introduced. The
paracentesis was performed. The catheter was removed and a dressing
was applied. The patient tolerated the procedure well without
immediate post procedural complication.
FINDINGS: A total of approximately 3 liters of serous fluid was removed.
Samples were sent to the laboratory as requested by the clinical
team.
IMPRESSION: Successful ultrasound-guided paracentesis yielding 3 liters of
peritoneal fluid.
